# Patient Record
Sex: Female | Born: 1990 | Race: White | Hispanic: No | Marital: Single | Smoking: Never smoker
Health system: Southern US, Community
[De-identification: ages and names within clinical notes are randomized; demographics above are authoritative.]

## PROBLEM LIST (undated history)

## (undated) DIAGNOSIS — Z789 Other specified health status: Secondary | ICD-10-CM

## (undated) DIAGNOSIS — E282 Polycystic ovarian syndrome: Secondary | ICD-10-CM

---

## 2013-12-29 ENCOUNTER — Other Ambulatory Visit (HOSPITAL_COMMUNITY)
Admission: RE | Admit: 2013-12-29 | Discharge: 2013-12-29 | Disposition: A | Discharge status: Home / Self Care | Payer: Managed Care, Other (non HMO) | Source: Ambulatory Visit | Attending: Obstetrics and Gynecology | Admitting: Obstetrics and Gynecology

## 2013-12-29 DIAGNOSIS — Z01419 Encounter for gynecological examination (general) (routine) without abnormal findings: Secondary | ICD-10-CM | POA: Diagnosis not present

## 2013-12-29 DIAGNOSIS — Z113 Encounter for screening for infections with a predominantly sexual mode of transmission: Secondary | ICD-10-CM | POA: Insufficient documentation

## 2015-02-25 ENCOUNTER — Other Ambulatory Visit (HOSPITAL_COMMUNITY)
Admission: RE | Admit: 2015-02-25 | Discharge: 2015-02-25 | Disposition: A | Discharge status: Home / Self Care | Payer: Managed Care, Other (non HMO) | Source: Ambulatory Visit | Attending: Obstetrics and Gynecology | Admitting: Obstetrics and Gynecology

## 2015-02-25 ENCOUNTER — Other Ambulatory Visit: Payer: Self-pay | Admitting: Obstetrics and Gynecology

## 2015-02-25 DIAGNOSIS — Z113 Encounter for screening for infections with a predominantly sexual mode of transmission: Secondary | ICD-10-CM | POA: Insufficient documentation

## 2015-02-25 DIAGNOSIS — Z01411 Encounter for gynecological examination (general) (routine) with abnormal findings: Secondary | ICD-10-CM | POA: Insufficient documentation

## 2015-02-26 LAB — CYTOLOGY - PAP

## 2015-04-27 ENCOUNTER — Ambulatory Visit (INDEPENDENT_AMBULATORY_CARE_PROVIDER_SITE_OTHER): Payer: Managed Care, Other (non HMO) | Admitting: Physician Assistant

## 2015-04-27 VITALS — BP 136/70 | HR 75 | Temp 98.8°F | Resp 18 | Ht 63.0 in | Wt 147.0 lb

## 2015-04-27 DIAGNOSIS — N3090 Cystitis, unspecified without hematuria: Secondary | ICD-10-CM

## 2015-04-27 DIAGNOSIS — R300 Dysuria: Secondary | ICD-10-CM | POA: Diagnosis not present

## 2015-04-27 LAB — POCT UA - MICROSCOPIC ONLY
Casts, Ur, LPF, POC: NEGATIVE
Crystals, Ur, HPF, POC: NEGATIVE
MUCUS UA: POSITIVE
RBC, urine, microscopic: NEGATIVE
Yeast, UA: NEGATIVE

## 2015-04-27 LAB — POCT URINALYSIS DIPSTICK
BILIRUBIN UA: NEGATIVE
Glucose, UA: NEGATIVE
Ketones, UA: NEGATIVE
Nitrite, UA: NEGATIVE
PROTEIN UA: NEGATIVE
Spec Grav, UA: 1.02
Urobilinogen, UA: 0.2
pH, UA: 6.5

## 2015-04-27 LAB — POCT URINE PREGNANCY: Preg Test, Ur: NEGATIVE

## 2015-04-27 MED ORDER — NITROFURANTOIN MONOHYD MACRO 100 MG PO CAPS: 100.0000 mg | ORAL_CAPSULE | Freq: Two times a day (BID) | ORAL | Status: AC

## 2015-04-27 NOTE — Progress Notes (Signed)
[facility] [address], [place] [address] [phone]  Date:  04/27/2015   Name:  Natalie Zimmerman   DOB:  03/23/1991   MRN:  [id number]  PCP:  No primary care provider on file.    Chief Complaint: Dysuria and Urinary Frequency   History of Present Illness:  This is a 24 y.o. female who is presenting with dysuria and urinary frequency x 2 days.  Dysuria: yes Urinary Frequency: yes Vaginal discharge: no Hematuria: no Abdominal pain: no Fever/chills: no Nausea/vominting: no Back pain: no LMP: 03/26/15, periods are not regular, sometimes goes 2 months without having a period. Sexually active: yes, not concerned for STDs. Previous UTI: Last UTI 1 year ago. Aggravating/Alleviating factors: Drinking lots of water.  Review of Systems:  Review of Systems See HPI  There are no active problems to display for this patient.   Prior to Admission medications   Not on File    No Known Allergies  History reviewed. No pertinent past surgical history.  Social History  Substance Use Topics  . Smoking status: Never Smoker   . Smokeless tobacco: None  . Alcohol Use: No    History reviewed. No pertinent family history.  Medication list has been reviewed and updated.  Physical Examination:  Physical Exam  Constitutional: She is oriented to person, place, and time. She appears well-developed and well-nourished. No distress.  HENT:  Head: Normocephalic and atraumatic.  Right Ear: Hearing normal.  Left Ear: Hearing normal.  Nose: Nose normal.  Eyes: Conjunctivae and lids are normal. Right eye exhibits no discharge. Left eye exhibits no discharge. No scleral icterus.  Cardiovascular: Normal rate, regular rhythm, normal heart sounds and normal pulses.   No murmur heard. Pulmonary/Chest: Effort normal and breath sounds normal. No respiratory distress. She has no wheezes. She has no rhonchi. She has no rales.  Abdominal: Soft. Normal appearance. There is  no tenderness. There is no CVA tenderness.  No suprapubic tenderness  Musculoskeletal: Normal range of motion.  Neurological: She is alert and oriented to person, place, and time.  Skin: Skin is warm, dry and intact. No lesion and no rash noted.  Psychiatric: She has a normal mood and affect. Her speech is normal and behavior is normal. Thought content normal.    BP 136/70 mmHg  Pulse 75  Temp(Src) 98.8 F (37.1 C) (Oral)  Resp 18  Ht 5\' 3"  (1.6 m)  Wt 147 lb (66.679 kg)  BMI 26.05 kg/m2  SpO2 94%  LMP 03/26/2015  Results for orders placed or performed in visit on 04/27/15  POCT UA - Microscopic Only  Result Value Ref Range   WBC, Ur, HPF, POC 10-12    RBC, urine, microscopic neg    Bacteria, U Microscopic trace    Mucus, UA positive    Epithelial cells, urine per micros 0-3    Crystals, Ur, HPF, POC neg    Casts, Ur, LPF, POC neg    Yeast, UA neg   POCT urinalysis dipstick  Result Value Ref Range   Color, UA yellow    Clarity, UA clear    Glucose, UA neg    Bilirubin, UA neg    Ketones, UA neg    Spec Grav, UA 1.020    Blood, UA tr-lysed    pH, UA 6.5    Protein, UA neg    Urobilinogen, UA 0.2    Nitrite, UA neg    Leukocytes, UA large (3+) (A) Negative  POCT  urine pregnancy  Result Value Ref Range   Preg Test, Ur Negative Negative   Assessment and Plan:  1. Cystitis 2. Dysuria UA suggestive of UTI. Urine culture pending. Macrobid BID x 7 days. Counseled on hydration and UTI prevention. Return in 1 week if sx not improved or at any time if sx worsen. - nitrofurantoin, macrocrystal-monohydrate, (MACROBID) 100 MG capsule; Take 1 capsule (100 mg total) by mouth 2 (two) times daily.  Dispense: 14 capsule; Refill: 0 - Urine culture - POCT urine pregnancy - POCT UA - Microscopic Only - POCT urinalysis dipstick   [place] Miners. Dyke Brackett, MHS [facility]  04/27/2015

## 2015-04-27 NOTE — Patient Instructions (Signed)
Take antibiotic twice a day for 7 days. Drink plenty of water (at least 64 oz per day). Cranberry juice/pills can help. You can buy AZO over the counter and take as needed for pain. Follow directions on the packaging. I will call you with results from your urine culture. If symptoms are not improving in 1 week or if symptoms worsen at any time, return to clinic.

## 2015-04-29 LAB — URINE CULTURE: Colony Count: >=100000

## 2015-05-09 ENCOUNTER — Encounter: Payer: Self-pay | Admitting: Family Medicine

## 2015-05-31 ENCOUNTER — Emergency Department (HOSPITAL_COMMUNITY)
Admission: EM | Admit: 2015-05-31 | Discharge: 2015-05-31 | Disposition: A | Discharge status: Home / Self Care | Payer: Managed Care, Other (non HMO) | Source: Home / Self Care | Attending: Family Medicine | Admitting: Family Medicine

## 2015-05-31 ENCOUNTER — Encounter (HOSPITAL_COMMUNITY): Payer: Self-pay

## 2015-05-31 DIAGNOSIS — T17320A Food in larynx causing asphyxiation, initial encounter: Secondary | ICD-10-CM

## 2015-05-31 DIAGNOSIS — R072 Precordial pain: Secondary | ICD-10-CM

## 2015-05-31 LAB — POCT PREGNANCY, URINE: Preg Test, Ur: NEGATIVE

## 2015-05-31 MED ORDER — SUCRALFATE 1 G PO TABS: 1.0000 g | ORAL_TABLET | Freq: Three times a day (TID) | ORAL | Status: DC

## 2015-05-31 NOTE — ED Provider Notes (Signed)
CSN: [id number]     Arrival date & time 05/31/15  1305 History   First MD Initiated Contact with Patient 05/31/15 1327     Chief Complaint  Patient presents with  . Pleurisy   (Consider location/radiation/quality/duration/timing/severity/associated sxs/prior Treatment) HPI Comments: 24 year old female was eating macaroni and cheese 4 days ago and began to choke. At just that she was starting to turn blue one of her great toe friends grabbed her around the waist and performed the abdominal thrust maneuver. This relieved her obstruction and she was able to breathe well. Since that time she has been complaining of retrosternal pain. This is affected by taking a deep breath. She denies abdominal pain. She denies problems with swallowing. She has been eating for the last 4 days without difficulty, pain, vomiting or nausea. Her primary concern is the persistence of the retrosternal discomfort.   History reviewed. No pertinent past medical history. History reviewed. No pertinent past surgical history. History reviewed. No pertinent family history. Social History  Substance Use Topics  . Smoking status: Never Smoker   . Smokeless tobacco: None  . Alcohol Use: No   OB History    No data available     Review of Systems  Constitutional: Negative for fever, activity change and fatigue.  Respiratory: Negative.  Negative for cough, shortness of breath, wheezing and stridor.        No choking since the initial event.  Cardiovascular: Positive for chest pain. Negative for leg swelling.  Gastrointestinal: Negative.   Genitourinary: Negative.   Skin: Negative.     Allergies  Review of patient's allergies indicates no known allergies.  Home Medications   Prior to Admission medications   Not on File   Meds Ordered and Administered this Visit  Medications - No data to display  BP 155/92 mmHg  Pulse 78  Temp(Src) 97.9 F (36.6 C) (Oral)  SpO2 99%  LMP  No data found.   Physical Exam   Constitutional: She is oriented to person, place, and time. She appears well-developed and well-nourished. No distress.  HENT:  Head: Normocephalic and atraumatic.  Mouth/Throat: Oropharynx is clear and moist. No oropharyngeal exudate.  Oropharynx normal. No erythema. No swelling no signs of injury. The epiglottis is easily visualized without erythema or injury. No sites of bleeding. No signs of infection.  Eyes: Conjunctivae and EOM are normal.  Neck: Normal range of motion. Neck supple. No tracheal deviation present. No thyromegaly present.  Cardiovascular: Normal rate, regular rhythm, normal heart sounds and intact distal pulses.   Pulmonary/Chest: Effort normal and breath sounds normal. No respiratory distress. She has no wheezes. She has no rales. She exhibits no tenderness.  Abdominal: Soft. There is no tenderness.  Musculoskeletal: Normal range of motion. She exhibits no edema.  Lymphadenopathy:    She has no cervical adenopathy.  Neurological: She is alert and oriented to person, place, and time.  Skin: Skin is warm and dry.  Psychiatric: She has a normal mood and affect.  Nursing note and vitals reviewed.   ED Course  Procedures (including critical care time)  Labs Review Labs Reviewed  POCT PREGNANCY, URINE    Imaging Review No results found.   Visual Acuity Review  Right Eye Distance:   Left Eye Distance:   Bilateral Distance:    Right Eye Near:   Left Eye Near:    Bilateral Near:         MDM   1. Retrosternal pain   2. Choking due to  food (regurgitated), initial encounter    suspect patient had irritation of the soft as after the abdominal thrust was performed. In the absence of other symptoms she is stable at this time and is recommended she follow-up with the gastroenterologist on page one. CHew food well. Take small bites at a time. For next few days no roughage type foods, salads. No steak or foods that may scratch the esophagus. Soft foods and lots  of liquids. Carafate as directed to coat esophagus and stomach for a few days. For worsening , new symptoms, inability to swallow, increase pain, vomiting go to the ED.     Hayden Rasmussen, NP 05/31/15 [phone]

## 2015-05-31 NOTE — ED Notes (Signed)
C/o trouble breathing, chest discomfort since choked on food Thursday, and someone did Heimlich on her abdominal area.Irregular menses

## 2015-05-31 NOTE — Discharge Instructions (Signed)
CHew food well. Take small bites at a time. For next few days no roughage type foods, salads. No steak or foods that may scratch the esophagus. Soft foods and lots of liquids. Carafate as directed to coat esophagus and stomach for a few days. For worsening , new symptoms, inability to swallow, increase pain, vomiting go to the ED.    Choking Choking occurs when a food or object gets stuck in the throat or trachea, blocking the airway. If the airway is partly blocked, coughing will usually cause the food or object to come out. If the airway is completely blocked, immediate action is needed to help it come out. A complete airway blockage is life threatening because it causes breathing to stop. Choking is a true medical emergency that requires fast, appropriate action by anyone available. SIGNS OF AIRWAY BLOCKAGE There is a partial airway blockage if you or the person who is choking is:   Able to breathe and speak.  Coughing loudly.  Making loud noises. There is a complete airway blockage if you or the person who is choking is:   Unable to breathe.  Making soft or high-pitched sounds while breathing.  Unable to cough or coughing weakly, ineffectively, or silently.  Unable to cry, speak, or make sounds.  Turning blue.  Holding the neck with both arms. This is the universal sign of choking. WHAT TO DO IF CHOKING OCCURS If there is a partial airway blockage, allow coughing to clear the airway. Do not try to drink until the food or object comes out. If someone else has a partial airway blockage, do not interfere. Stay with him or her and watch for signs of complete airway blockage until the food or object comes out.  If there is a complete airway blockage or if there is a partial airway blockage and the food or object does not come out, perform abdominal thrusts (also referred to as the Heimlich maneuver). Abdominal thrusts are used to create an artificial cough to try to clear the airway.  Performing abdominal thrusts is part of a series of steps that should be done to help someone who is choking. Abdominal thrusts are usually done by someone else, but if you are alone, you can perform abdominal thrusts on yourself. Follow the procedure below that best fits your situation.  IF SOMEONE ELSE IS CHOKING: For a conscious adult:  1. Ask the person whether he or she is choking. If the person nods, continue to step 2. 2. Stand or kneel behind the person and lean him or her forward slightly. 3. Make a fist with 1 hand, put your arms around the person, and grasp your fist with your other hand. Place the thumb side of your fist in the person's abdomen, just below the ribs. 4. Press inward and upward with both hands. 5. Repeat this maneuver until the object comes out and the person is able to breathe or until the person loses consciousness. For an unconscious adult: 1. Shout for help. If someone responds, have him or her call local emergency services (911 in [place] If no one responds, call local emergency services yourself if possible. 2. Begin CPR, starting with compressions. Every time you open the airway to give rescue breaths, open the person's mouth. If you can see the food or object and it can be easily pulled out, remove it with your fingers. 3. After 5 cycles or 2 minutes of CPR, call local emergency services (911 in [place] if you or someone  else did not already call. For a conscious adult who is obese or in the later stages of pregnancy: Abdominal thrusts may not be effective when helping people who are in the later stages of pregnancy or who are obese. In these instances, chest thrusts can be used.  1. Ask the person whether he or she is choking. If the person nods and has signs of complete airway blockage, continue to step 2. 2. Stand behind the person and wrap your arms around his or her chest (with your arms under the person's armpits). 3. Make a fist with 1 hand. Place the thumb side  of your fist on the middle of the person's breastbone. 4. Grab your fist with your other hand and thrust backward. Continue this until the object comes out or until the person becomes unconscious. For an unconscious adult who is obese or in the later stages of pregnancy:  1. Shout for help. If someone responds, have him or her call local emergency services (911 in [place] If no one responds, call local emergency services yourself if possible. 2. Begin CPR, starting with compressions. Every time you open the airway to give rescue breaths, open the person's mouth. If you can see the food or object and it can be easily pulled out, remove it with your fingers. 3. After 5 cycles or 2 minutes of CPR, call local emergency services (911 in [place] if you or someone else did not already call. Note that abdominal thrusts (below the rib cage) should be used for a pregnant woman if possible. This should be possible until the later stages of pregnancy when there is no longer enough room between the enlarging uterus and the rib cage to perform the maneuver. At that point, chest thrusts must be used as described. IF YOU ARE CHOKING: 1. Call local emergency services (911 in [place] if near a landline. Do not worry about communicating what is happening. Do not hang up the phone. Someone may be sent to help you anyway. 2. Make a fist with 1 hand. Put the thumb side of the fist against your stomach, just above the belly button and well below the breastbone. If you are pregnant or obese, put your fist on your chest instead, just below the breastbone and just above your lowest ribs. 3. Hold your fist with your other hand and bend over a hard surface, such as a table or chair. 4. Forcefully push your fist in and up. 5. Continue to do this until the food or object comes out. PREVENTION  To be prepared if choking occurs, learn how to correctly perform abdominal thrusts and give CPR by taking a certified first-aid training course.    SEEK IMMEDIATE MEDICAL CARE IF:  You have a fever after choking stops.  You have problems breathing after choking stops.  You received the Heimlich maneuver. MAKE SURE YOU:  Understand these instructions.  Watch your condition.  Get help right away if you are not doing well or get worse. Document Released: 10/05/2004 Document Revised: 01/12/2014 Document Reviewed: 04/09/2012 [facility] Patient Information 2015 [place], [place]. This information is not intended to replace advice given to you by your health care provider. Make sure you discuss any questions you have with your health care provider.

## 2016-02-29 ENCOUNTER — Other Ambulatory Visit: Payer: Self-pay | Admitting: Obstetrics and Gynecology

## 2016-02-29 ENCOUNTER — Other Ambulatory Visit (HOSPITAL_COMMUNITY)
Admission: RE | Admit: 2016-02-29 | Discharge: 2016-02-29 | Disposition: A | Discharge status: Home / Self Care | Payer: Managed Care, Other (non HMO) | Source: Ambulatory Visit | Attending: Obstetrics and Gynecology | Admitting: Obstetrics and Gynecology

## 2016-02-29 DIAGNOSIS — Z01411 Encounter for gynecological examination (general) (routine) with abnormal findings: Secondary | ICD-10-CM | POA: Diagnosis present

## 2016-02-29 DIAGNOSIS — Z113 Encounter for screening for infections with a predominantly sexual mode of transmission: Secondary | ICD-10-CM | POA: Insufficient documentation

## 2016-02-29 DIAGNOSIS — Z1151 Encounter for screening for human papillomavirus (HPV): Secondary | ICD-10-CM | POA: Diagnosis present

## 2016-03-01 LAB — CYTOLOGY - PAP

## 2016-04-04 ENCOUNTER — Other Ambulatory Visit: Payer: Self-pay | Admitting: Obstetrics and Gynecology

## 2016-10-10 ENCOUNTER — Other Ambulatory Visit (HOSPITAL_COMMUNITY)
Admission: RE | Admit: 2016-10-10 | Discharge: 2016-10-10 | Disposition: A | Discharge status: Home / Self Care | Payer: Managed Care, Other (non HMO) | Source: Ambulatory Visit | Attending: Obstetrics and Gynecology | Admitting: Obstetrics and Gynecology

## 2016-10-10 ENCOUNTER — Other Ambulatory Visit: Payer: Self-pay | Admitting: Obstetrics and Gynecology

## 2016-10-10 DIAGNOSIS — Z01411 Encounter for gynecological examination (general) (routine) with abnormal findings: Secondary | ICD-10-CM | POA: Insufficient documentation

## 2016-10-13 LAB — CYTOLOGY - PAP: Diagnosis: NEGATIVE

## 2017-09-11 DIAGNOSIS — F53 Postpartum depression: Secondary | ICD-10-CM

## 2017-09-11 HISTORY — DX: Postpartum depression: F53.0

## 2018-06-21 DIAGNOSIS — Z3490 Encounter for supervision of normal pregnancy, unspecified, unspecified trimester: Secondary | ICD-10-CM | POA: Diagnosis not present

## 2018-06-21 DIAGNOSIS — Z3201 Encounter for pregnancy test, result positive: Secondary | ICD-10-CM | POA: Diagnosis not present

## 2018-06-21 DIAGNOSIS — Z3401 Encounter for supervision of normal first pregnancy, first trimester: Secondary | ICD-10-CM | POA: Diagnosis not present

## 2018-06-21 DIAGNOSIS — Z3400 Encounter for supervision of normal first pregnancy, unspecified trimester: Secondary | ICD-10-CM | POA: Diagnosis not present

## 2018-06-24 DIAGNOSIS — Z3687 Encounter for antenatal screening for uncertain dates: Secondary | ICD-10-CM | POA: Diagnosis not present

## 2018-07-19 ENCOUNTER — Other Ambulatory Visit: Payer: Self-pay | Admitting: Obstetrics and Gynecology

## 2018-07-19 ENCOUNTER — Other Ambulatory Visit (HOSPITAL_COMMUNITY)
Admission: RE | Admit: 2018-07-19 | Discharge: 2018-07-19 | Disposition: A | Discharge status: Home / Self Care | Payer: BLUE CROSS/BLUE SHIELD | Source: Ambulatory Visit | Attending: Obstetrics and Gynecology | Admitting: Obstetrics and Gynecology

## 2018-07-19 DIAGNOSIS — Z3401 Encounter for supervision of normal first pregnancy, first trimester: Secondary | ICD-10-CM | POA: Diagnosis not present

## 2018-07-19 DIAGNOSIS — Z3402 Encounter for supervision of normal first pregnancy, second trimester: Secondary | ICD-10-CM | POA: Diagnosis not present

## 2018-07-19 DIAGNOSIS — N898 Other specified noninflammatory disorders of vagina: Secondary | ICD-10-CM | POA: Diagnosis not present

## 2018-07-23 LAB — CYTOLOGY - PAP
Chlamydia: NEGATIVE
Diagnosis: NEGATIVE
Neisseria Gonorrhea: NEGATIVE

## 2018-08-20 DIAGNOSIS — Z36 Encounter for antenatal screening for chromosomal anomalies: Secondary | ICD-10-CM | POA: Diagnosis not present

## 2018-08-20 DIAGNOSIS — Z3402 Encounter for supervision of normal first pregnancy, second trimester: Secondary | ICD-10-CM | POA: Diagnosis not present

## 2018-10-15 DIAGNOSIS — Z3490 Encounter for supervision of normal pregnancy, unspecified, unspecified trimester: Secondary | ICD-10-CM | POA: Diagnosis not present

## 2018-10-15 DIAGNOSIS — Z3403 Encounter for supervision of normal first pregnancy, third trimester: Secondary | ICD-10-CM | POA: Diagnosis not present

## 2018-11-05 DIAGNOSIS — Z23 Encounter for immunization: Secondary | ICD-10-CM | POA: Diagnosis not present

## 2018-11-27 ENCOUNTER — Other Ambulatory Visit: Payer: Self-pay

## 2018-11-27 ENCOUNTER — Encounter (HOSPITAL_COMMUNITY)
Admission: AD | Disposition: A | Discharge status: Home / Self Care | Payer: Self-pay | Source: Home / Self Care | Attending: Obstetrics & Gynecology

## 2018-11-27 ENCOUNTER — Inpatient Hospital Stay (HOSPITAL_COMMUNITY)
Admission: AD | Admit: 2018-11-27 | Discharge: 2018-11-30 | DRG: 788 | Disposition: A | Discharge status: Home / Self Care | Payer: BLUE CROSS/BLUE SHIELD | Attending: Obstetrics & Gynecology | Admitting: Obstetrics & Gynecology

## 2018-11-27 ENCOUNTER — Inpatient Hospital Stay (HOSPITAL_COMMUNITY): Payer: BLUE CROSS/BLUE SHIELD | Admitting: Anesthesiology

## 2018-11-27 ENCOUNTER — Encounter (HOSPITAL_COMMUNITY): Payer: Self-pay | Admitting: *Deleted

## 2018-11-27 DIAGNOSIS — D649 Anemia, unspecified: Secondary | ICD-10-CM | POA: Diagnosis present

## 2018-11-27 DIAGNOSIS — Z3A33 33 weeks gestation of pregnancy: Secondary | ICD-10-CM | POA: Diagnosis not present

## 2018-11-27 DIAGNOSIS — Z23 Encounter for immunization: Secondary | ICD-10-CM | POA: Diagnosis not present

## 2018-11-27 DIAGNOSIS — R011 Cardiac murmur, unspecified: Secondary | ICD-10-CM | POA: Diagnosis not present

## 2018-11-27 DIAGNOSIS — O9902 Anemia complicating childbirth: Secondary | ICD-10-CM | POA: Diagnosis not present

## 2018-11-27 DIAGNOSIS — Z3A00 Weeks of gestation of pregnancy not specified: Secondary | ICD-10-CM | POA: Diagnosis not present

## 2018-11-27 DIAGNOSIS — O328XX0 Maternal care for other malpresentation of fetus, not applicable or unspecified: Secondary | ICD-10-CM | POA: Diagnosis not present

## 2018-11-27 DIAGNOSIS — O321XX0 Maternal care for breech presentation, not applicable or unspecified: Secondary | ICD-10-CM | POA: Diagnosis not present

## 2018-11-27 DIAGNOSIS — Q211 Atrial septal defect: Secondary | ICD-10-CM | POA: Diagnosis not present

## 2018-11-27 LAB — CBC
HCT: 34.2 % — ABNORMAL LOW (ref 36.0–46.0)
Hemoglobin: 11.1 g/dL — ABNORMAL LOW (ref 12.0–15.0)
MCH: 29.2 pg (ref 26.0–34.0)
MCHC: 32.5 g/dL (ref 30.0–36.0)
MCV: 90 fL (ref 80.0–100.0)
NRBC: 0 % (ref 0.0–0.2)
PLATELETS: 309 10*3/uL (ref 150–400)
RBC: 3.8 MIL/uL — ABNORMAL LOW (ref 3.87–5.11)
RDW: 12.1 % (ref 11.5–15.5)
WBC: 13.1 10*3/uL — ABNORMAL HIGH (ref 4.0–10.5)

## 2018-11-27 LAB — COMPREHENSIVE METABOLIC PANEL
ALK PHOS: 96 U/L (ref 38–126)
ALT: 16 U/L (ref 0–44)
ANION GAP: 11 (ref 5–15)
AST: 20 U/L (ref 15–41)
Albumin: 3 g/dL — ABNORMAL LOW (ref 3.5–5.0)
BILIRUBIN TOTAL: 0.4 mg/dL (ref 0.3–1.2)
BUN: 6 mg/dL (ref 6–20)
CO2: 19 mmol/L — AB (ref 22–32)
Calcium: 9.1 mg/dL (ref 8.9–10.3)
Chloride: 106 mmol/L (ref 98–111)
Creatinine, Ser: 0.57 mg/dL (ref 0.44–1.00)
GFR calc Af Amer: >60 mL/min (ref >60)
GFR calc non Af Amer: >60 mL/min (ref >60)
GLUCOSE: 95 mg/dL (ref 70–99)
POTASSIUM: 3.9 mmol/L (ref 3.5–5.1)
Sodium: 136 mmol/L (ref 135–145)
Total Protein: 6.4 g/dL — ABNORMAL LOW (ref 6.5–8.1)

## 2018-11-27 LAB — TYPE AND SCREEN
ABO/RH(D): O POS
Antibody Screen: NEGATIVE

## 2018-11-27 LAB — PROTEIN / CREATININE RATIO, URINE
Creatinine, Urine: 68.8 mg/dL
PROTEIN CREATININE RATIO: 0.13 mg/mg{creat} (ref 0.00–0.15)
Total Protein, Urine: 9 mg/dL

## 2018-11-27 SURGERY — Surgical Case
Anesthesia: Spinal

## 2018-11-27 MED ORDER — LACTATED RINGERS IV SOLN
INTRAVENOUS | Status: DC | PRN
  Administered 2018-11-27 (×3): via INTRAVENOUS

## 2018-11-27 MED ORDER — NALBUPHINE HCL 10 MG/ML IJ SOLN: 5.0000 mg | INTRAMUSCULAR | Status: DC | PRN

## 2018-11-27 MED ORDER — KETOROLAC TROMETHAMINE 30 MG/ML IJ SOLN: 30.0000 mg | Freq: Four times a day (QID) | INTRAMUSCULAR | Status: AC | PRN

## 2018-11-27 MED ORDER — DIPHENHYDRAMINE HCL 25 MG PO CAPS: 25.0000 mg | ORAL_CAPSULE | ORAL | Status: DC | PRN

## 2018-11-27 MED ORDER — OXYTOCIN 10 UNIT/ML IJ SOLN
INTRAVENOUS | Status: DC | PRN
  Administered 2018-11-27: 40 [IU] via INTRAVENOUS

## 2018-11-27 MED ORDER — SCOPOLAMINE 1 MG/3DAYS TD PT72: 1.0000 | MEDICATED_PATCH | Freq: Once | TRANSDERMAL | Status: DC

## 2018-11-27 MED ORDER — MEPERIDINE HCL 25 MG/ML IJ SOLN: 6.2500 mg | INTRAMUSCULAR | Status: DC | PRN

## 2018-11-27 MED ORDER — DIPHENHYDRAMINE HCL 50 MG/ML IJ SOLN: 12.5000 mg | INTRAMUSCULAR | Status: DC | PRN

## 2018-11-27 MED ORDER — CEFAZOLIN SODIUM-DEXTROSE 2-4 GM/100ML-% IV SOLN
INTRAVENOUS | Status: AC
  Filled 2018-11-27: qty 100

## 2018-11-27 MED ORDER — OXYTOCIN 40 UNITS IN NORMAL SALINE INFUSION - SIMPLE MED
INTRAVENOUS | Status: AC
  Filled 2018-11-27: qty 1000

## 2018-11-27 MED ORDER — SOD CITRATE-CITRIC ACID 500-334 MG/5ML PO SOLN
30.0000 mL | Freq: Once | ORAL | Status: AC
  Administered 2018-11-27: 30 mL via ORAL

## 2018-11-27 MED ORDER — FENTANYL CITRATE (PF) 100 MCG/2ML IJ SOLN
INTRAMUSCULAR | Status: DC | PRN
  Administered 2018-11-27: 15 ug via INTRATHECAL

## 2018-11-27 MED ORDER — MORPHINE SULFATE (PF) 0.5 MG/ML IJ SOLN
INTRAMUSCULAR | Status: AC
  Filled 2018-11-27: qty 10

## 2018-11-27 MED ORDER — DEXAMETHASONE SODIUM PHOSPHATE 10 MG/ML IJ SOLN
INTRAMUSCULAR | Status: DC | PRN
  Administered 2018-11-27: 10 mg via INTRAVENOUS

## 2018-11-27 MED ORDER — PHENYLEPHRINE HCL-NACL 20-0.9 MG/250ML-% IV SOLN
INTRAVENOUS | Status: AC
  Filled 2018-11-27: qty 250

## 2018-11-27 MED ORDER — SODIUM BICARBONATE 8.4 % IV SOLN
INTRAVENOUS | Status: DC | PRN
  Administered 2018-11-27: 1.6 mL via EPIDURAL

## 2018-11-27 MED ORDER — NALOXONE HCL 4 MG/10ML IJ SOLN
1.0000 ug/kg/h | INTRAVENOUS | Status: DC | PRN
  Filled 2018-11-27: qty 5

## 2018-11-27 MED ORDER — FENTANYL CITRATE (PF) 100 MCG/2ML IJ SOLN: 25.0000 ug | INTRAMUSCULAR | Status: DC | PRN

## 2018-11-27 MED ORDER — BETAMETHASONE SOD PHOS & ACET 6 (3-3) MG/ML IJ SUSP
12.0000 mg | Freq: Once | INTRAMUSCULAR | Status: AC
  Administered 2018-11-27: 12 mg via INTRAMUSCULAR
  Filled 2018-11-27: qty 2

## 2018-11-27 MED ORDER — NALOXONE HCL 0.4 MG/ML IJ SOLN: 0.4000 mg | INTRAMUSCULAR | Status: DC | PRN

## 2018-11-27 MED ORDER — LACTATED RINGERS IV BOLUS
1000.0000 mL | Freq: Once | INTRAVENOUS | Status: AC
  Administered 2018-11-27: 1000 mL via INTRAVENOUS

## 2018-11-27 MED ORDER — MORPHINE SULFATE (PF) 0.5 MG/ML IJ SOLN
INTRAMUSCULAR | Status: DC | PRN
  Administered 2018-11-27: .15 mg via INTRATHECAL

## 2018-11-27 MED ORDER — SOD CITRATE-CITRIC ACID 500-334 MG/5ML PO SOLN
ORAL | Status: AC
  Filled 2018-11-27: qty 15

## 2018-11-27 MED ORDER — CEFAZOLIN SODIUM-DEXTROSE 2-4 GM/100ML-% IV SOLN
2.0000 g | INTRAVENOUS | Status: AC
  Administered 2018-11-27: 2 g via INTRAVENOUS

## 2018-11-27 MED ORDER — PHENYLEPHRINE HCL-NACL 20-0.9 MG/250ML-% IV SOLN
INTRAVENOUS | Status: DC | PRN
  Administered 2018-11-27: 60 ug/min via INTRAVENOUS

## 2018-11-27 MED ORDER — NALBUPHINE HCL 10 MG/ML IJ SOLN: 5.0000 mg | Freq: Once | INTRAMUSCULAR | Status: DC | PRN

## 2018-11-27 MED ORDER — SODIUM CHLORIDE 0.9% FLUSH
3.0000 mL | INTRAVENOUS | Status: DC | PRN
  Administered 2018-11-29: 3 mL via INTRAVENOUS
  Filled 2018-11-27: qty 3

## 2018-11-27 MED ORDER — ONDANSETRON HCL 4 MG/2ML IJ SOLN: 4.0000 mg | Freq: Three times a day (TID) | INTRAMUSCULAR | Status: DC | PRN

## 2018-11-27 MED ORDER — FENTANYL CITRATE (PF) 100 MCG/2ML IJ SOLN
INTRAMUSCULAR | Status: AC
  Filled 2018-11-27: qty 2

## 2018-11-27 MED ORDER — KETOROLAC TROMETHAMINE 30 MG/ML IJ SOLN
30.0000 mg | Freq: Four times a day (QID) | INTRAMUSCULAR | Status: AC | PRN
  Administered 2018-11-27: 30 mg via INTRAMUSCULAR

## 2018-11-27 MED ORDER — ONDANSETRON HCL 4 MG/2ML IJ SOLN
INTRAMUSCULAR | Status: AC
  Filled 2018-11-27: qty 2

## 2018-11-27 MED ORDER — KETOROLAC TROMETHAMINE 30 MG/ML IJ SOLN
INTRAMUSCULAR | Status: AC
  Filled 2018-11-27: qty 1

## 2018-11-27 MED ORDER — TERBUTALINE SULFATE 1 MG/ML IJ SOLN
0.2500 mg | Freq: Once | INTRAMUSCULAR | Status: AC
  Administered 2018-11-27: 0.25 mg via SUBCUTANEOUS
  Filled 2018-11-27: qty 1

## 2018-11-27 MED ORDER — ONDANSETRON HCL 4 MG/2ML IJ SOLN
INTRAMUSCULAR | Status: DC | PRN
  Administered 2018-11-27: 4 mg via INTRAVENOUS

## 2018-11-27 MED ORDER — ACETAMINOPHEN 500 MG PO TABS
1000.0000 mg | ORAL_TABLET | Freq: Four times a day (QID) | ORAL | Status: AC
  Administered 2018-11-28 (×4): 1000 mg via ORAL
  Filled 2018-11-27 (×4): qty 2

## 2018-11-27 MED ORDER — DEXAMETHASONE SODIUM PHOSPHATE 10 MG/ML IJ SOLN
INTRAMUSCULAR | Status: AC
  Filled 2018-11-27: qty 1

## 2018-11-27 SURGICAL SUPPLY — 40 items
BARRIER ADHS 3X4 INTERCEED (GAUZE/BANDAGES/DRESSINGS) ×3 IMPLANT
BENZOIN TINCTURE PRP APPL 2/3 (GAUZE/BANDAGES/DRESSINGS) ×3 IMPLANT
CHLORAPREP W/TINT 26ML (MISCELLANEOUS) ×3 IMPLANT
CLAMP CORD UMBIL (MISCELLANEOUS) IMPLANT
CLOSURE WOUND 1/2 X4 (GAUZE/BANDAGES/DRESSINGS) ×1
CLOTH BEACON ORANGE TIMEOUT ST (SAFETY) ×3 IMPLANT
DERMABOND ADVANCED (GAUZE/BANDAGES/DRESSINGS)
DERMABOND ADVANCED .7 DNX12 (GAUZE/BANDAGES/DRESSINGS) IMPLANT
DRSG OPSITE POSTOP 4X10 (GAUZE/BANDAGES/DRESSINGS) ×3 IMPLANT
ELECT REM PT RETURN 9FT ADLT (ELECTROSURGICAL) ×3
ELECTRODE REM PT RTRN 9FT ADLT (ELECTROSURGICAL) ×1 IMPLANT
EXTRACTOR VACUUM KIWI (MISCELLANEOUS) IMPLANT
GLOVE BIOGEL PI IND STRL 7.0 (GLOVE) ×3 IMPLANT
GLOVE BIOGEL PI INDICATOR 7.0 (GLOVE) ×6
GLOVE ECLIPSE 6.5 STRL STRAW (GLOVE) ×3 IMPLANT
GOWN STRL REUS W/TWL LRG LVL3 (GOWN DISPOSABLE) ×9 IMPLANT
HEMOSTAT ARISTA ABSORB 3G PWDR (HEMOSTASIS) ×3 IMPLANT
KIT ABG SYR 3ML LUER SLIP (SYRINGE) IMPLANT
NEEDLE HYPO 25X5/8 SAFETYGLIDE (NEEDLE) IMPLANT
NS IRRIG 1000ML POUR BTL (IV SOLUTION) ×3 IMPLANT
PACK C SECTION WH (CUSTOM PROCEDURE TRAY) ×3 IMPLANT
PAD ABD 7.5X8 STRL (GAUZE/BANDAGES/DRESSINGS) ×3 IMPLANT
PAD OB MATERNITY 4.3X12.25 (PERSONAL CARE ITEMS) ×3 IMPLANT
PENCIL SMOKE EVAC W/HOLSTER (ELECTROSURGICAL) ×3 IMPLANT
RTRCTR C-SECT PINK 25CM LRG (MISCELLANEOUS) ×3 IMPLANT
STRIP CLOSURE SKIN 1/2X4 (GAUZE/BANDAGES/DRESSINGS) ×2 IMPLANT
SUT PLAIN 0 NONE (SUTURE) IMPLANT
SUT PLAIN 2 0 (SUTURE) ×2
SUT PLAIN 2 0 XLH (SUTURE) IMPLANT
SUT PLAIN ABS 2-0 CT1 27XMFL (SUTURE) ×1 IMPLANT
SUT VIC AB 0 CT1 27 (SUTURE) ×4
SUT VIC AB 0 CT1 27XBRD ANBCTR (SUTURE) ×2 IMPLANT
SUT VIC AB 0 CTX 36 (SUTURE) ×6
SUT VIC AB 0 CTX36XBRD ANBCTRL (SUTURE) ×3 IMPLANT
SUT VIC AB 2-0 CT1 27 (SUTURE) ×4
SUT VIC AB 2-0 CT1 TAPERPNT 27 (SUTURE) ×2 IMPLANT
SUT VIC AB 4-0 KS 27 (SUTURE) ×3 IMPLANT
TOWEL OR 17X24 6PK STRL BLUE (TOWEL DISPOSABLE) ×3 IMPLANT
TRAY FOLEY W/BAG SLVR 14FR LF (SET/KITS/TRAYS/PACK) IMPLANT
WATER STERILE IRR 1000ML POUR (IV SOLUTION) ×6 IMPLANT

## 2018-11-27 NOTE — Transfer of Care (Signed)
Immediate Anesthesia Transfer of Care Note  Patient: Natalie Zimmerman  Procedure(s) Performed: CESAREAN SECTION (N/A )  Patient Location: PACU  Anesthesia Type:Spinal  Level of Consciousness: awake, alert  and oriented  Airway & Oxygen Therapy: Patient Spontanous Breathing  Post-op Assessment: Report given to RN and Post -op Vital signs reviewed and stable  Post vital signs: Reviewed and stable  Last Vitals:  Vitals Value Taken Time  BP 149/135 11/27/2018 10:16 PM  Temp    Pulse 95 11/27/2018 10:19 PM  Resp 29 11/27/2018 10:19 PM  SpO2 97 % 11/27/2018 10:19 PM  Vitals shown include unvalidated device data.  Last Pain:  Vitals:   11/27/18 1922  TempSrc:   PainSc: 6       Patients Stated Pain Goal: 0 (11/27/18 1922)  Complications: No apparent anesthesia complications

## 2018-11-27 NOTE — MAU Note (Addendum)
Called NICU charge nurse and let her know about the patient. She stated she will call the NEO and discuss the patient with him. Howell Rucks nurse stated @2046  that Neo is aware of pt.   Ozan in department at 2030 on phone with OR.  L&D charge nurse notified @ 2034.  [facility] charge nurse notified @ 2035.  Anesthesia Casilda Carls notified 2038. At bedside @2045 .

## 2018-11-27 NOTE — Op Note (Signed)
PreOp Diagnosis:  -Intrauterine pregnancy @ [redacted]w[redacted]d -Preterm labor -Breech presentation PostOp Diagnosis: same Procedure: Primary C-section Surgeon: Dr. Myna Hidalgo Anesthesia: spinal Complications: none EBL: 300cc UOP: 150cc Fluids: 2300cc   Findings: Female infant from complete breech presentation, tight nuchal cord x 1, normal uterus tubes and ovaries bilaterally  PROCEDURE:  Informed consent was obtained from the patient with risks, benefits, complications, treatment options, and expected outcomes discussed with the patient.  The patient concurred with the proposed plan, giving informed consent with form signed.   The patient was taken to Operating Room, and identified with the procedure verified as C-Section Delivery with Time Out. With induction of anesthesia, the patient was prepped and draped in the usual sterile fashion. A Pfannenstiel incision was made and carried down through the subcutaneous tissue to the fascia.  Rectus muscle noted to have active bleeding- figure of eight placed- hemostasis noted.  The fascia was incised in the midline and extended transversely. The superior aspect of the fascial incision was grasped with Kochers elevated and the underlying muscle dissected off. The inferior aspect of the facial incision was in similar fashion, grasped elevated and rectus muscles dissected off. The peritoneum was identified and entered. Peritoneal incision was extended longitudinally. The utero-vesical peritoneal reflection was identified and incised transversely with the [facility] scissors, the incision extended laterally, the bladder flap created digitally. A low transverse uterine incision was made- sacrum present at delivery.  Right leg was flexed and delivered, infant was rotated and delivery of the 2nd leg occurred.  The baby was then rotated to allow for delivery of each arm.  M[place]-Smellie-Veit was used to flex the head and delivered.  After the umbilical cord was clamped and  cut cord blood was obtained for evaluation.   The placenta was removed intact and appeared normal. The uterine outline, tubes and ovaries appeared normal. The uterine incision was closed with running locked sutures of 0 Vicryl and a second layer of the same stitch was used in an imbricating fashion.  Excellent hemostasis was obtained.  The pericolic gutters were then cleared of all clots and debris.  Interceed was placed over the hysterotomy. The peritoneum was closed in a running fashion.  Arista was placed on the rectus muscle- where prior bleeding was noted.  Excellent hemostasis was noted. The fascia was reapproximated with running sutures of 0 Vicryl. The subcutaneous tissue was reapproximated with 2-0 plain gut suture.  The skin was closed with 4-0 vicryl in a subcuticular fashion.  Instrument, sponge, and needle counts were correct prior the abdominal closure and at the conclusion of the case. The patient was taken to recovery in stable condition.  Myna Hidalgo, DO [phone] (cell) [phone] (office)

## 2018-11-27 NOTE — MAU Note (Signed)
Pt reports to MAU c/o vaginal bleeding that started around 1900 and pain that is every 3-5 min that started around 1515. Pt reports she passed clots and came straight here. Pt reports no complications in the pregnancy thus far. Pt denies LOF. +FM.

## 2018-11-27 NOTE — Anesthesia Preprocedure Evaluation (Signed)
Anesthesia Evaluation  Patient identified by MRN, date of birth, ID band Patient awake    Reviewed: Allergy & Precautions, NPO status , Patient's Chart, lab work & pertinent test results  Airway Mallampati: II  TM Distance: >3 FB     Dental   Pulmonary neg pulmonary ROS,    breath sounds clear to auscultation       Cardiovascular negative cardio ROS   Rhythm:Regular Rate:Normal     Neuro/Psych negative neurological ROS     GI/Hepatic negative GI ROS, Neg liver ROS,   Endo/Other  negative endocrine ROS  Renal/GU negative Renal ROS     Musculoskeletal   Abdominal   Peds  Hematology  (+) anemia ,   Anesthesia Other Findings   Reproductive/Obstetrics (+) Pregnancy (G1 c/s for breech)                             Lab Results  Component Value Date   WBC 13.1 (H) 11/27/2018   HGB 11.1 (L) 11/27/2018   HCT 34.2 (L) 11/27/2018   MCV 90.0 11/27/2018   PLT 309 11/27/2018    Anesthesia Physical Anesthesia Plan  ASA: II  Anesthesia Plan: Spinal   Post-op Pain Management:    Induction:   PONV Risk Score and Plan: Treatment may vary due to age or medical condition, Dexamethasone and Ondansetron  Airway Management Planned: Natural Airway  Additional Equipment:   Intra-op Plan:   Post-operative Plan:   Informed Consent: I have reviewed the patients History and Physical, chart, labs and discussed the procedure including the risks, benefits and alternatives for the proposed anesthesia with the patient or authorized representative who has indicated his/her understanding and acceptance.       Plan Discussed with: CRNA  Anesthesia Plan Comments:         Anesthesia Quick Evaluation

## 2018-11-27 NOTE — H&P (Signed)
HPI: 28 y/o G1P0 @ [redacted]w[redacted]d estimated gestational age (as dated by LMP c/w 20 week ultrasound) presents complaining of painful contractions that started around 4pm and continued to get progressively worse.  Additionally around 7pm.   no Leaking of Fluid,   + Vaginal Bleeding,   + Uterine Contractions,  + Fetal Movement.  Prenatal care has been provided by Dr. Richardson Dopp  ROS: no HA, no epigastric pain, no visual changes.    Pregnancy uncomplicated   Prenatal Transfer Tool  Maternal Diabetes: No Genetic Screening: Normal Maternal Ultrasounds/Referrals: Normal Fetal Ultrasounds or other Referrals:  None Maternal Substance Abuse:  No Significant Maternal Medications:  None Significant Maternal Lab Results: None  GBS unknown   PNL:  GBS unknown, Rub Immune, Hep B neg, RPR NR, HIV neg, GC/C neg, glucola:normal O positive, antibody neg   OBHx: primip PMHx:  none Meds:  PNV Allergy:  No Known Allergies SurgHx: none SocHx:   no Tobacco, no  EtOH, no Illicit Drugs  O: BP 128/75   Pulse 89   Temp 98.4 F (36.9 C) (Oral)   Resp 18   Ht 5\' 2"  (1.575 m)   Wt 74.2 kg   SpO2 100%   BMI 29.90 kg/m  Gen. AAOx3, NAD CV.  RRR  Resp. Normal respiratory rate and effort Abd. Gravid,  no tenderness,  no rigidity,  no guarding Extr.  no edema B/L , no calf tenderness, neg Homan's B/L FHT: 140 baseline, moderate variability, + accels,  no decels Toco: q 3 min SVE: deferred- per MAU staff 7cm with bulging bag- footling presentation  Labs:  Results for orders placed or performed during the hospital encounter of 11/27/18 (from the past 24 hour(s))  CBC     Status: Abnormal   Collection Time: 11/27/18  7:57 PM  Result Value Ref Range   WBC 13.1 (H) 4.0 - 10.5 K/uL   RBC 3.80 (L) 3.87 - 5.11 MIL/uL   Hemoglobin 11.1 (L) 12.0 - 15.0 g/dL   HCT 34.1 (L) 93.7 - 90.2 %   MCV 90.0 80.0 - 100.0 fL   MCH 29.2 26.0 - 34.0 pg   MCHC 32.5 30.0 - 36.0 g/dL   RDW 40.9 73.5 - 32.9 %   Platelets 309 150 -  400 K/uL   nRBC 0.0 0.0 - 0.2 %  Comprehensive metabolic panel     Status: Abnormal   Collection Time: 11/27/18  7:57 PM  Result Value Ref Range   Sodium 136 135 - 145 mmol/L   Potassium 3.9 3.5 - 5.1 mmol/L   Chloride 106 98 - 111 mmol/L   CO2 19 (L) 22 - 32 mmol/L   Glucose, Bld 95 70 - 99 mg/dL   BUN 6 6 - 20 mg/dL   Creatinine, Ser 9.24 0.44 - 1.00 mg/dL   Calcium 9.1 8.9 - 26.8 mg/dL   Total Protein 6.4 (L) 6.5 - 8.1 g/dL   Albumin 3.0 (L) 3.5 - 5.0 g/dL   AST 20 15 - 41 U/L   ALT 16 0 - 44 U/L   Alkaline Phosphatase 96 38 - 126 U/L   Total Bilirubin 0.4 0.3 - 1.2 mg/dL   GFR calc non Af Amer >60 >60 mL/min   GFR calc Af Amer >60 >60 mL/min   Anion gap 11 5 - 15  Type and screen     Status: None (Preliminary result)   Collection Time: 11/27/18  8:20 PM  Result Value Ref Range   ABO/RH(D) PENDING  Antibody Screen PENDING    Sample Expiration      11/30/2018 Performed at [facility], [address] [place], [place] [address]      A/P:  28 y.o. G1P0 @ [redacted]w[redacted]d EGA who presents in active preterm labor with Breech presentation -FWB:  NICHD Cat I FHTs -Preterm labor: Terbutaline given, Betamethasone ordered and IV fluids given -GBS: unknown -Due to breech presentation recommendation for primary C-section- Risk benefits and alternatives of cesarean section were discussed with the patient including but not limited to infection, bleeding, damage to bowel , bladder and baby with the need for further surgery. Pt voiced understanding and desires to proceed.   Myna Hidalgo, DO [phone] (cell) [phone] (office)

## 2018-11-27 NOTE — MAU Provider Note (Signed)
History     CSN: [id number]  Arrival date and time: 11/27/18 4709   First Provider Initiated Contact with Patient 11/27/18 1958      Chief Complaint  Patient presents with  . Vaginal Bleeding  . Abdominal Pain   Ms. Natalie Zimmerman is a 28 y.o. G1P0 at [redacted]w[redacted]d who presents to MAU for vaginal bleeding which began 59. Pt reports around 1515 today,"pains" started in her pelvis. Pt reports the pain is midline, intermittent, at first just felt like pressure, then turned to pain that she is unable to describe further. Pt rates pain as 5-6/10. Pt reports at this time she called her office and was told to drink water and lay on her left hand side. Pt reports she was instructed to report to MAU if the pains got worse. Pt started timing the pains a round 5pm and noticed they were coming every 3-58min. At 1900 she went to the bathroom and noticed clots of blood about the size of a quarter on her underwear and in the toilet. Pt also reports she went to the bathroom in MAU x1 and saw clots here as well. Pt reports pain has gotten worse since 1515 today, but is not coming any closer together.  Pt denies LOF, decreased FM.  Lightheaded/dizzy? no Smoking/drug use? no HTN? no Current pregnancy problems? no Hx of C/S or GYN surgery? no Blood Type? unknown Allergies? no Current medications? PNVs Current PNC & next appt? [facility], next appt 12/04/2018    OB History    Gravida  1   Para      Term      Preterm      AB      Living        SAB      TAB      Ectopic      Multiple      Live Births              History reviewed. No pertinent past medical history.  History reviewed. No pertinent surgical history.  Family History  Problem Relation Age of Onset  . Cancer Maternal Grandmother   . Heart disease Maternal Grandfather   . Cancer Paternal Grandfather   . Cancer Maternal Aunt   . Cancer Paternal Aunt     Social History   Tobacco Use  . Smoking status: Never Smoker   . Smokeless tobacco: Never Used  Substance Use Topics  . Alcohol use: No    Alcohol/week: 0.0 standard drinks  . Drug use: No    Allergies: No Known Allergies  Medications Prior to Admission  Medication Sig Dispense Refill Last Dose  . sucralfate (CARAFATE) 1 G tablet Take 1 tablet (1 g total) by mouth 4 (four) times daily -  with meals and at bedtime. 20 tablet 0     Review of Systems  Respiratory: Negative for shortness of breath.   Cardiovascular: Negative for chest pain.  Gastrointestinal: Negative for abdominal pain, constipation, diarrhea, nausea and vomiting.  Genitourinary: Positive for vaginal bleeding. Negative for vaginal discharge.  Neurological: Negative for dizziness, weakness, light-headedness and headaches.   Physical Exam   Blood pressure (!) 145/97, pulse 92, temperature 98.4 F (36.9 C), temperature source Oral, resp. rate 18, height 5\' 2"  (1.575 m), weight 74.2 kg, SpO2 99 %.  Physical Exam  Constitutional: She is oriented to person, place, and time. She appears well-developed and well-nourished. No distress.  HENT:  Head: Normocephalic.  Respiratory: Effort normal.  GI:  There is generalized abdominal tenderness.  Genitourinary: There is no rash, tenderness, lesion or injury on the right labia. There is no rash, tenderness, lesion or injury on the left labia.    Vaginal bleeding present.  There is bleeding in the vagina.    Genitourinary Comments: On speculum exam, bulging membranes visualized. On cervical exam, bulging membranes palpated along with footling breech (confirmed by [place]), 7-8cm dilated, 90% effaced, cord not palpated.   Neurological: She is alert and oriented to person, place, and time.  Skin: Skin is warm and dry. She is not diaphoretic.  Psychiatric: She has a normal mood and affect. Her behavior is normal.    MAU Course  Procedures  MDM -preeclampsia labs placed d/t BP on admission -after exam, Dr. Charlotta Newton and Dr. Despina Hidden notifed -type &  screen added for OR -IV fluids, bethamethsone, terbutaline given -EFM baseline 130, mod variability, pos accels, neg decels. TOCO ctx q1-1min. -Dr. Charlotta Newton arrived, pt to OR  Pt informed that the ultrasound is considered a limited OB ultrasound and is not intended to be a complete ultrasound exam.  Patient also informed that the ultrasound is not being completed with the intent of assessing for fetal or placental anomalies or any pelvic abnormalities.  Explained that the purpose of today's ultrasound is to assess for  presentation. Breech position noted on [place]. Patient acknowledges the purpose of the exam and the limitations of the study.    Assessment and Plan   1. Preterm labor in third trimester with preterm delivery, single or unspecified fetus   2. [redacted] weeks gestation of pregnancy    -to OR with Dr. Vira Blanco E [name] 11/27/2018, 7:59 PM

## 2018-11-27 NOTE — Anesthesia Procedure Notes (Signed)
Spinal  Patient location during procedure: OR Start time: 11/27/2018 9:00 PM End time: 11/27/2018 9:04 PM Staffing Anesthesiologist: Marcene Duos, MD Performed: anesthesiologist  Preanesthetic Checklist Completed: patient identified, site marked, surgical consent, pre-op evaluation, timeout performed, IV checked, risks and benefits discussed and monitors and equipment checked Spinal Block Patient position: sitting Prep: DuraPrep Patient monitoring: heart rate, cardiac monitor, continuous pulse ox and blood pressure Approach: midline Location: L3-4 Injection technique: single-shot Needle Needle type: Pencan  Needle gauge: 24 G Needle length: 9 cm Assessment Sensory level: T4

## 2018-11-28 ENCOUNTER — Encounter (HOSPITAL_COMMUNITY): Payer: Self-pay | Admitting: Obstetrics & Gynecology

## 2018-11-28 LAB — CBC
HCT: 30.3 % — ABNORMAL LOW (ref 36.0–46.0)
Hemoglobin: 10 g/dL — ABNORMAL LOW (ref 12.0–15.0)
MCH: 29.5 pg (ref 26.0–34.0)
MCHC: 33 g/dL (ref 30.0–36.0)
MCV: 89.4 fL (ref 80.0–100.0)
PLATELETS: 266 10*3/uL (ref 150–400)
RBC: 3.39 MIL/uL — ABNORMAL LOW (ref 3.87–5.11)
RDW: 12.2 % (ref 11.5–15.5)
WBC: 18 10*3/uL — ABNORMAL HIGH (ref 4.0–10.5)
nRBC: 0 % (ref 0.0–0.2)

## 2018-11-28 LAB — ABO/RH: ABO/RH(D): O POS

## 2018-11-28 MED ORDER — SIMETHICONE 80 MG PO CHEW
80.0000 mg | CHEWABLE_TABLET | Freq: Three times a day (TID) | ORAL | Status: DC
  Administered 2018-11-28 – 2018-11-29 (×4): 80 mg via ORAL
  Filled 2018-11-28 (×4): qty 1

## 2018-11-28 MED ORDER — IBUPROFEN 600 MG PO TABS
600.0000 mg | ORAL_TABLET | Freq: Four times a day (QID) | ORAL | Status: DC | PRN
  Administered 2018-11-29 – 2018-11-30 (×4): 600 mg via ORAL
  Filled 2018-11-28 (×4): qty 1

## 2018-11-28 MED ORDER — ACETAMINOPHEN 325 MG PO TABS: 650.0000 mg | ORAL_TABLET | ORAL | Status: DC | PRN

## 2018-11-28 MED ORDER — LACTATED RINGERS IV SOLN: INTRAVENOUS | Status: DC

## 2018-11-28 MED ORDER — ZOLPIDEM TARTRATE 5 MG PO TABS: 5.0000 mg | ORAL_TABLET | Freq: Every evening | ORAL | Status: DC | PRN

## 2018-11-28 MED ORDER — TETANUS-DIPHTH-ACELL PERTUSSIS 5-2.5-18.5 LF-MCG/0.5 IM SUSP: 0.5000 mL | Freq: Once | INTRAMUSCULAR | Status: DC

## 2018-11-28 MED ORDER — KETOROLAC TROMETHAMINE 30 MG/ML IJ SOLN
30.0000 mg | Freq: Four times a day (QID) | INTRAMUSCULAR | Status: AC
  Administered 2018-11-28 (×3): 30 mg via INTRAVENOUS
  Filled 2018-11-28 (×3): qty 1

## 2018-11-28 MED ORDER — PRENATAL MULTIVITAMIN CH
1.0000 | ORAL_TABLET | Freq: Every day | ORAL | Status: DC
  Administered 2018-11-28 – 2018-11-29 (×2): 1 via ORAL
  Filled 2018-11-28 (×2): qty 1

## 2018-11-28 MED ORDER — COCONUT OIL OIL: 1.0000 "application " | TOPICAL_OIL | Status: DC | PRN

## 2018-11-28 MED ORDER — OXYCODONE HCL 5 MG PO TABS: 5.0000 mg | ORAL_TABLET | Freq: Four times a day (QID) | ORAL | Status: DC | PRN

## 2018-11-28 MED ORDER — DIBUCAINE 1 % RE OINT: 1.0000 "application " | TOPICAL_OINTMENT | RECTAL | Status: DC | PRN

## 2018-11-28 MED ORDER — SENNOSIDES-DOCUSATE SODIUM 8.6-50 MG PO TABS
2.0000 | ORAL_TABLET | ORAL | Status: DC
  Administered 2018-11-28 – 2018-11-30 (×3): 2 via ORAL
  Filled 2018-11-28 (×3): qty 2

## 2018-11-28 MED ORDER — OXYTOCIN 40 UNITS IN NORMAL SALINE INFUSION - SIMPLE MED: 2.5000 [IU]/h | INTRAVENOUS | Status: AC

## 2018-11-28 MED ORDER — MENTHOL 3 MG MT LOZG: 1.0000 | LOZENGE | OROMUCOSAL | Status: DC | PRN

## 2018-11-28 MED ORDER — WITCH HAZEL-GLYCERIN EX PADS: 1.0000 "application " | MEDICATED_PAD | CUTANEOUS | Status: DC | PRN

## 2018-11-28 MED ORDER — SIMETHICONE 80 MG PO CHEW: 80.0000 mg | CHEWABLE_TABLET | ORAL | Status: DC | PRN

## 2018-11-28 MED ORDER — SIMETHICONE 80 MG PO CHEW
80.0000 mg | CHEWABLE_TABLET | ORAL | Status: DC
  Administered 2018-11-28 – 2018-11-30 (×3): 80 mg via ORAL
  Filled 2018-11-28 (×3): qty 1

## 2018-11-28 MED ORDER — DIPHENHYDRAMINE HCL 25 MG PO CAPS: 25.0000 mg | ORAL_CAPSULE | Freq: Four times a day (QID) | ORAL | Status: DC | PRN

## 2018-11-28 NOTE — Plan of Care (Signed)
  Problem: Activity: Goal: Ability to tolerate increased activity will improve Outcome: Progressing   Problem: Coping: Goal: Ability to identify and utilize available resources and services will improve Outcome: Progressing   Problem: Life Cycle: Goal: Chance of risk for complications during the postpartum period will decrease Outcome: Progressing

## 2018-11-28 NOTE — Progress Notes (Signed)
Subjective: Postpartum Day 1: Cesarean Delivery Patient reports tolerating PO.   Pain is well controlled. Newborn is doing well in NICU  Objective: Vital signs in last 24 hours: Temp:  [97.6 F (36.4 C)-98.4 F (36.9 C)] 98 F (36.7 C) (03/19 1202) Pulse Rate:  [66-100] 66 (03/19 1202) Resp:  [17-27] 18 (03/19 1202) BP: (100-149)/(63-135) 101/68 (03/19 1202) SpO2:  [96 %-100 %] 99 % (03/19 1202) Weight:  [74.2 kg] 74.2 kg (03/18 1921)  Physical Exam:  General: alert, cooperative and no distress Lochia: appropriate Uterine Fundus: firm Incision: bandage intact .. small 2 cm area of serosanguinous stain on honeycomb  DVT Evaluation: No evidence of DVT seen on physical exam.  Recent Labs    11/27/18 1957 11/28/18 0430  HGB 11.1* 10.0*  HCT 34.2* 30.3*    Assessment/Plan: Status post Cesarean section. Doing well postoperatively.  Continue current care Dr. Dion Body covering 11/29/2018.  Gerald Leitz 11/28/2018, 3:36 PM

## 2018-11-28 NOTE — Lactation Note (Signed)
This note was copied from a baby's chart. Lactation Consultation Note  Patient Name: Natalie Zimmerman [id number] Date: 11/28/2018 Reason for consult: Initial assessment;Preterm <34wks;Infant < 6lbs;Primapara;1st time breastfeeding;NICU baby  Visited with mom of a 15 hours old < 5 lbs preterm NICU female who is being fed exclusively breast milk, baby is on donor milk now but mom is already pumping. She was very happy to report to [facility] that she already got some volume, 3 ml on her last pumping sessions (didn't get anything on the first two). Explained to mom that the purpose of pumping early on is mainly for breast stimulation and not to get volume, praised her for her efforts.   Mom is a P1, she attended online BF classes and she's already familiar with hand expression, when doing it with her RN she was able to see some colostrum. She's planning on buying a DEBP out of pocket; she also has insurance but she wants to have her pump right away, LC recommended a couple of brands. Reviewed pumping, breastmilk storage guidelines for NICU babies, supply and demand and feeding plan.   Feeding plan:  1. Encouraged mom to pump every 3 hours and at least once at night 2. Mom will continue taking her EBM to the NICU according to breastmilk storage guidelines  BF brochure and pumping log were reviewed. Mom reported all questions and concerns were answered, she's aware of LC services and will call PRN.  Maternal Data Formula Feeding for Exclusion: No Has patient been taught Hand Expression?: Yes Does the patient have breastfeeding experience prior to this delivery?: No  Feeding Feeding Type: Donor Breast Milk  Interventions Interventions: Breast feeding basics reviewed;DEBP  Lactation Tools Discussed/Used Tools: Pump Breast pump type: Double-Electric Breast Pump WIC Program: No Pump Review: Milk Storage;Setup, frequency, and cleaning Initiated by:: RN Date initiated:: 11/28/18   Consult  Status Consult Status: Follow-up Date: 11/29/18 Follow-up type: In-patient    Alisen Marsiglia Venetia Constable 11/28/2018, 1:00 PM

## 2018-11-28 NOTE — Progress Notes (Signed)
Pt in NICU

## 2018-11-28 NOTE — Anesthesia Postprocedure Evaluation (Signed)
Anesthesia Post Note  Patient: Natalie Zimmerman  Procedure(s) Performed: CESAREAN SECTION (N/A )     Patient location during evaluation: PACU Anesthesia Type: Spinal Level of consciousness: awake and alert Pain management: pain level controlled Vital Signs Assessment: post-procedure vital signs reviewed and stable Respiratory status: spontaneous breathing and respiratory function stable Cardiovascular status: blood pressure returned to baseline and stable Postop Assessment: spinal receding Anesthetic complications: no    Last Vitals:  Vitals:   11/27/18 2315 11/27/18 2350  BP:  127/67  Pulse: 85 85  Resp: 19 20  Temp:  36.6 C  SpO2: 100% 99%    Last Pain:  Vitals:   11/27/18 2350  TempSrc: Oral  PainSc: 0-No pain   Pain Goal: Patients Stated Pain Goal: 0 (11/27/18 1922)              Epidural/Spinal Function Cutaneous sensation: Tingles (11/27/18 2350), Patient able to flex knees: Yes (11/27/18 2350), Patient able to lift hips off bed: Yes (11/27/18 2350), Back pain beyond tenderness at insertion site: No (11/27/18 2350), Progressively worsening motor and/or sensory loss: No (11/27/18 2350), Bowel and/or bladder incontinence post epidural: No (11/27/18 2350)  Kennieth Rad

## 2018-11-28 NOTE — Lactation Note (Signed)
This note was copied from a baby's chart. Lactation Consultation Note  Patient Name: Natalie Zimmerman [id number] Date: 11/28/2018   [facility] Initial Visit:  Attempted to visit mother, however, she is not in her room at the present time.  LC will visit later today.   [place] Greb R [name] 11/28/2018, 10:01 AM

## 2018-11-29 NOTE — Progress Notes (Signed)
Subjective: Postop Day 2: Cesarean Delivery No complaints.  Pain controlled.  Lochia normal.  Breast feeding yes.  Baby doing well in NICU.  Objective: Temp:  [97.5 F (36.4 C)] 97.5 F (36.4 C) (03/20 0634) Pulse Rate:  [72] 72 (03/20 0634) Resp:  [17] 17 (03/20 0634) BP: (117)/(75) 117/75 (03/20 3570)  Physical Exam: Gen: NAD Lochia: Not visualized Uterine Fundus: firm, appropriately tender Incision: clean, dry and intact, healing well, old soilage on honeycomb dressing DVT Evaluation: minimal Edema present, no calf tenderness bilaterally   Recent Labs    11/27/18 1957 11/28/18 0430  HGB 11.1* 10.0*  HCT 34.2* 30.3*    Assessment/Plan: Status post C-section-doing well postoperatively. PT delivery. Continue routine post op care. Female baby stable in NICU.    Desires circumcision.  Consented. Plan for discharge tomorrow.    Geryl Rankins 11/29/2018, 5:29 PM

## 2018-11-29 NOTE — Lactation Note (Signed)
Lactation Consultation Note  Patient Name: Natalie Zimmerman [id number] Date: 11/29/2018    [facility] Follow Up Visit:  Attempted to visit with mother, however, she was in the NICU.                  [place]wood Adcock R Larue Drawdy 11/29/2018, 10:42 AM

## 2018-11-29 NOTE — Lactation Note (Signed)
Lactation Consultation Note  Patient Name: Natalie Zimmerman [id number] Date: 11/29/2018    [facility] Follow Up Visit:  Attempted to visit with mother, however, she was in the NICU.  I will return later.      Skyy Nilan R Haralambos Yeatts 11/29/2018, 8:16 AM

## 2018-11-29 NOTE — Progress Notes (Signed)
Patient screened out for psychosocial assessment since none of the following apply:  Psychosocial stressors documented in mother or baby's chart  Gestation less than 32 weeks  Code at delivery   Infant with anomalies Please contact the Clinical Social Worker if specific needs arise, by MOB's request, or if MOB scores greater than 9/yes to question 10 on Edinburgh Postpartum Depression Screen.  Kassidi Elza, LCSW Clinical Social Worker [facility] Cell#: [phone]     

## 2018-11-30 ENCOUNTER — Ambulatory Visit: Payer: Self-pay

## 2018-11-30 MED ORDER — IBUPROFEN 600 MG PO TABS: 600.0000 mg | ORAL_TABLET | Freq: Four times a day (QID) | ORAL | 0 refills | Status: DC | PRN

## 2018-11-30 MED ORDER — OXYCODONE HCL 5 MG PO TABS: 5.0000 mg | ORAL_TABLET | Freq: Four times a day (QID) | ORAL | 0 refills | Status: AC | PRN

## 2018-11-30 MED ORDER — PRENATAL PLUS 27-1 MG PO TABS: 1.0000 | ORAL_TABLET | Freq: Every day | ORAL | 0 refills | Status: DC

## 2018-11-30 NOTE — Discharge Instructions (Signed)
Cesarean Delivery °Cesarean birth, or cesarean delivery, is the surgical delivery of a baby through an incision in the abdomen and the uterus. This may be referred to as a C-section. This procedure may be scheduled ahead of time, or it may be done in an emergency situation. °Tell a health care provider about: °· Any allergies you have. °· All medicines you are taking, including vitamins, herbs, eye drops, creams, and over-the-counter medicines. °· Any problems you or family members have had with anesthetic medicines. °· Any blood disorders you have. °· Any surgeries you have had. °· Any medical conditions you have. °· Whether you or any members of your family have a history of deep vein thrombosis (DVT) or pulmonary embolism (PE). °What are the risks? °Generally, this is a safe procedure. However, problems may occur, including: °· Infection. °· Bleeding. °· Allergic reactions to medicines. °· Damage to other structures or organs. °· Blood clots. °· Injury to your baby. °What happens before the procedure? °General instructions °· Follow instructions from your health care provider about eating or drinking restrictions. °· If you know that you are going to have a cesarean delivery, do not shave your pubic area. Shaving before the procedure may increase your risk of infection. °· Plan to have someone take you home from the hospital. °· Ask your health care provider what steps will be taken to prevent infection. These may include: °? Removing hair at the surgery site. °? Washing skin with a germ-killing soap. °? Taking antibiotic medicine. °· Depending on the reason for your cesarean delivery, you may have a physical exam or additional testing, such as an ultrasound. °· You may have your blood or urine tested. °Questions for your health care provider °· Ask your health care provider about: °? Changing or stopping your regular medicines. This is especially important if you are taking diabetes medicines or blood  thinners. °? Your pain management plan. This is especially important if you plan to breastfeed your baby. °? How long you will be in the hospital after the procedure. °? Any concerns you may have about receiving blood products, if you need them during the procedure. °? Cord blood banking, if you plan to collect your baby's umbilical cord blood. °· You may also want to ask your health care provider: °? Whether you will be able to hold or breastfeed your baby while you are still in the operating room. °? Whether your baby can stay with you immediately after the procedure and during your recovery. °? Whether a family member or a person of your choice can go with you into the operating room and stay with you during the procedure, immediately after the procedure, and during your recovery. °What happens during the procedure? ° °· An IV will be inserted into one of your veins. °· Fluid and medicines, such as antibiotics, will be given before the surgery. °· Fetal monitors will be placed on your abdomen to check your baby's heart rate. °· You may be given a special warming gown to wear to keep your temperature stable. °· A catheter may be inserted into your bladder through your urethra. This drains your urine during the procedure. °· You may be given one or more of the following: °? A medicine to numb the area (local anesthetic). °? A medicine to make you fall asleep (general anesthetic). °? A medicine (regional anesthetic) that is injected into your back or through a small thin tube placed in your back (spinal anesthetic or epidural anesthetic).   This numbs everything below the injection site and allows you to stay awake during your procedure. If this makes you feel nauseous, tell your health care provider. Medicines will be available to help reduce any nausea you may feel.  An incision will be made in your abdomen, and then in your uterus.  If you are awake during your procedure, you may feel tugging and pulling in  your abdomen, but you should not feel pain. If you feel pain, tell your health care provider immediately.  Your baby will be removed from your uterus. You may feel more pressure or pushing while this happens.  Immediately after birth, your baby will be dried and kept warm. You may be able to hold and breastfeed your baby.  The umbilical cord may be clamped and cut during this time. This usually occurs after waiting a period of 1-2 minutes after delivery.  Your placenta will be removed from your uterus.  Your incisions will be closed with stitches (sutures). Staples, skin glue, or adhesive strips may also be applied to the incision in your abdomen.  Bandages (dressings) may be placed over the incision in your abdomen. The procedure may vary among health care providers and hospitals. What happens after the procedure?  Your blood pressure, heart rate, breathing rate, and blood oxygen level will be monitored until you are discharged from the hospital.  You may continue to receive fluids and medicines through an IV.  You will have some pain. Medicines will be available to help control your pain.  To help prevent blood clots: ? You may be given medicines. ? You may have to wear compression stockings or devices. ? You will be encouraged to walk around when you are able.  Hospital staff will encourage and support bonding with your baby. Your hospital may have you and your baby to stay in the same room (rooming in) during your hospital stay to encourage successful bonding and breastfeeding.  You may be encouraged to cough and breathe deeply often. This helps to prevent lung problems.  If you have a catheter draining your urine, it will be removed as soon as possible after your procedure. Summary  Cesarean birth, or cesarean delivery, is the surgical delivery of a baby through an incision in the abdomen and the uterus.  Follow instructions from your health care provider about eating or  drinking restrictions before the procedure.  You will have some pain after the procedure. Medicines will be available to help control your pain.  Hospital staff will encourage and support bonding with your baby after the procedure. Your hospital may have you and your baby to stay in the same room (rooming in) during your hospital stay to encourage successful bonding and breastfeeding. This information is not intended to replace advice given to you by your health care provider. Make sure you discuss any questions you have with your health care provider. Document Released: 08/28/2005 Document Revised: 03/04/2018 Document Reviewed: 03/04/2018 Elsevier Interactive Patient Education  2019 [employer].   Preterm Labor and Birth Information Pregnancy normally lasts 39-41 weeks. Preterm labor is when labor starts early. It starts before you have been pregnant for 37 whole weeks. What are the risk factors for preterm labor? Preterm labor is more likely to occur in women who:  Have an infection while pregnant.  Have a cervix that is short.  Have gone into preterm labor before.  Have had surgery on their cervix.  Are younger than age 82.  Are older than age  35.  Are African American.  Are pregnant with two or more babies.  Take street drugs while pregnant.  Smoke while pregnant.  Do not gain enough weight while pregnant.  Got pregnant right after another pregnancy. What are the symptoms of preterm labor? Symptoms of preterm labor include:  Cramps. The cramps may feel like the cramps some women get during their period. The cramps may happen with watery poop (diarrhea).  Pain in the belly (abdomen).  Pain in the lower back.  Regular contractions or tightening. It may feel like your belly is getting tighter.  Pressure in the lower belly that seems to get stronger.  More fluid (discharge) leaking from the vagina. The fluid may be watery or bloody.  Water breaking. Why is it  important to notice signs of preterm labor? Babies who are born early may not be fully developed. They have a higher chance for:  Long-term heart problems.  Long-term lung problems.  Trouble controlling body systems, like breathing.  Bleeding in the brain.  A condition called cerebral palsy.  Learning difficulties.  Death. These risks are highest for babies who are born before 34 weeks of pregnancy. How is preterm labor treated? Treatment depends on:  How long you were pregnant.  Your condition.  The health of your baby. Treatment may involve:  Having a stitch (suture) placed in your cervix. When you give birth, your cervix opens so the baby can come out. The stitch keeps the cervix from opening too soon.  Staying at the hospital.  Taking or getting medicines, such as: ? Hormone medicines. ? Medicines to stop contractions. ? Medicines to help the babys lungs develop. ? Medicines to prevent your baby from having cerebral palsy. What should I do if I am in preterm labor? If you think you are going into labor too soon, call your doctor right away. How can I prevent preterm labor?  Do not use any tobacco products. ? Examples of these are cigarettes, chewing tobacco, and e-cigarettes. ? If you need help quitting, ask your doctor.  Do not use street drugs.  Do not use any medicines unless you ask your doctor if they are safe for you.  Talk with your doctor before taking any herbal supplements.  Make sure you gain enough weight.  Watch for infection. If you think you might have an infection, get it checked right away.  If you have gone into preterm labor before, tell your doctor. This information is not intended to replace advice given to you by your health care provider. Make sure you discuss any questions you have with your health care provider. Document Released: 11/24/2008 Document Revised: 02/08/2016 Document Reviewed: 01/19/2016 Elsevier Interactive Patient  Education  2019 [employer].   Caring for Your Premature Infant at Home A premature infant is a baby that is born early, before 37 weeks of pregnancy. Babies who are born early are more likely to develop certain problems and complications. Because of this, they may need extra care at home. What kinds of problems is my infant at risk for? Babies who are born early are at risk for certain problems, including:  Breathing problems.  Low birth weight.  Feeding problems.  Sleeping problems.  Yellowing of the skin (jaundice).  Infections such as pneumonia. The earlier your baby is born, the more likely he or she is to have these problems. Babies born very early are at risk for more serious problems, including:  Severe breathing problems.  Eyesight problems.  Brain  development concerns.  Behavioral and emotional development concerns.  Growth and developmental delays.  Cerebral palsy.  Severe feeding problems, or problems passing stool. Follow these instructions at home: Caring for your infant  Prior to going home with your premature infant, understand your infant's conditions and needs.  Follow all your baby's health care provider's instructions for providing support and care to your preterm infant.  Bond with your infant as much as possible by holding, rocking, and cuddling. This can be skin to skin contact.  Keep your infant warm. Dress your infant in layers and keep him or her away from drafts, especially in cold months of the year. Safety   Consider learning infant CPR.  When driving, monitor your infant in the car seat until he or she grows and matures. It is important to do this because preterm infants may have problems with their airway when in an infant car seat. A small rolled diaper or blanket between the crotch strap and the infant may be added to help keep them in a safe position.  Place your baby to sleep on his or her back unless your baby's health care  provider has told you not to do so. This is the best and most important way you can lower the risk of sudden infant death syndrome (SIDS).  Monitor your infant when he or she is feeding for any changes in skin color or problems breathing. Premature infants may have problems coordinating sucking, swallowing, and breathing. General instructions   Wash your hands thoroughly after going to the bathroom or changing a diaper. Preterm infants may be more prone to infection. Use soap and water or hand sanitizer if soap and water are not available.  Consider joining a support group in order to get help from organizations and groups that understand your challenges.  Keep all follow-up visits as told by your childs health care provider. This is important. Where to find more information  [employer]: www.marchofdimes.com  Prematurity.org: www.prematurity.org Contact a health care provider if:  Your infant has trouble feeding.  Your infant has trouble sleeping.  Your infant develops jaundice.  Your infant shows signs of infection, such as a fever or yellow or green nasal mucus.  You are not able to console your baby when he or she cries. Get help right away if:  Your infant has a bluish color to his or her skin.  Your infant has trouble breathing.  Your infant who is younger than 3 months has a temperature of 100F (38C) or higher. Summary  A premature infant is a baby that is born early, before 37 weeks of pregnancy.  Babies who are born early are more likely to develop certain problems and complications.  Prior to going home with your premature infant, understand your infant's conditions and needs.  Get support from organizations and groups that understand your challenges. Consider joining a support group. This information is not intended to replace advice given to you by your health care provider. Make sure you discuss any questions you have with your health care  provider. Document Released: 11/18/2003 Document Revised: 10/25/2016 Document Reviewed: 10/25/2016 Elsevier Interactive Patient Education  2019 [employer].  Postpartum Baby Blues The postpartum period begins right after the birth of a baby. During this time, there is often a lot of joy and excitement. It is also a time of many changes in the life of the parents. No matter how many times a mother gives birth, each child brings new challenges  to the family, including different ways of relating to one another. It is common to have feelings of excitement along with confusing changes in moods, emotions, and thoughts. You may feel happy one minute and sad or stressed the next. These feelings of sadness usually happen in the period right after you have your baby, and they go away within a week or two. This is called the "baby blues." What are the causes? There is no known cause of baby blues. It is likely caused by a combination of factors. However, changes in hormone levels after childbirth are believed to trigger some of the symptoms. Other factors that can play a role in these mood changes include:  Lack of sleep.  Stressful life events, such as poverty, caring for a loved one, or death of a loved one.  Genetics. What are the signs or symptoms? Symptoms of this condition include:  Brief changes in mood, such as going from extreme happiness to sadness.  Decreased concentration.  Difficulty sleeping.  Crying spells and tearfulness.  Loss of appetite.  Irritability.  Anxiety. If the symptoms of baby blues last for more than 2 weeks or become more severe, you may have postpartum depression. How is this diagnosed? This condition is diagnosed based on an evaluation of your symptoms. There are no medical or lab tests that lead to a diagnosis, but there are various questionnaires that a health care provider may use to identify women with the baby blues or postpartum depression. How is this  treated? Treatment is not needed for this condition. The baby blues usually go away on their own in 1-2 weeks. Social support is often all that is needed. You will be encouraged to get adequate sleep and rest. Follow these instructions at home: Lifestyle      Get as much rest as you can. Take a nap when the baby sleeps.  Exercise regularly as told by your health care provider. Some women find yoga and walking to be helpful.  Eat a balanced and nourishing diet. This includes plenty of fruits and vegetables, whole grains, and lean proteins.  Do little things that you enjoy. Have a cup of tea, take a bubble bath, read your favorite magazine, or listen to your favorite music.  Avoid alcohol.  Ask for help with household chores, cooking, grocery shopping, or running errands. Do not try to do everything yourself. Consider hiring a postpartum doula to help. This is a professional who specializes in providing support to new mothers.  Try not to make any major life changes during pregnancy or right after giving birth. This can add stress. General instructions  Talk to people close to you about how you are feeling. Get support from your partner, family members, friends, or other new moms. You may want to join a support group.  Find ways to cope with stress. This may include: ? Writing your thoughts and feelings in a journal. ? Spending time outside. ? Spending time with people who make you laugh.  Try to stay positive in how you think. Think about the things you are grateful for.  Take over-the-counter and prescription medicines only as told by your health care provider.  Let your health care provider know if you have any concerns.  Keep all postpartum visits as told by your health care provider. This is important. Contact a health care provider if:  Your baby blues do not go away after 2 weeks. Get help right away if:  You have thoughts of taking  your own life (suicidal  thoughts).  You think you may harm the baby or other people.  You see or hear things that are not there (hallucinations). Summary  After giving birth, you may feel happy one minute and sad or stressed the next. Feelings of sadness that happen right after the baby is born and go away after a week or two are called the "baby blues."  You can manage the baby blues by getting enough rest, eating a healthy diet, exercising, spending time with supportive people, and finding ways to cope with stress.  If feelings of sadness and stress last longer than 2 weeks or get in the way of caring for your baby, talk to your health care provider. This may mean you have postpartum depression. This information is not intended to replace advice given to you by your health care provider. Make sure you discuss any questions you have with your health care provider. Document Released: 06/01/2004 Document Revised: 10/24/2016 Document Reviewed: 10/24/2016 Elsevier Interactive Patient Education  2019 [employer].

## 2018-11-30 NOTE — Discharge Summary (Signed)
OB Discharge Summary     Patient Name: Natalie Zimmerman DOB: 1991-06-12 MRN: [id number]  Date of admission: 11/27/2018 Delivering MD: Myna Hidalgo   Date of discharge: 11/30/2018  Admitting diagnosis: BLEEDING Intrauterine pregnancy: [redacted]w[redacted]d     Secondary diagnosis:  Active Problems:   Preterm labor     Discharge diagnosis: Preterm Pregnancy Delivered                                                                                                Post partum procedures:n/a  Augmentation: n/a  Complications: None  Hospital course:  Onset of Labor With Unplanned C/S  28 y.o. yo G1P0101 at [redacted]w[redacted]d was admitted in Active Labor on 11/27/2018. Patient had a labor course significant for preterm labor with footling breech. Membrane Rupture Time/Date: 9:25 PM ,11/27/2018   The patient went for cesarean section due to Malpresentation, and delivered a Viable infant,11/27/2018  Details of operation can be found in separate operative note. Patient had an uncomplicated postpartum course.  She is ambulating,tolerating a regular diet, passing flatus, and urinating well.  Patient is discharged home in stable condition 11/30/18.  Physical exam  Vitals:   11/29/18 0634 11/29/18 2124 11/29/18 2233 11/30/18 0546  BP: 117/75 128/80 117/64 107/70  Pulse: 72 67 74 71  Resp: 17 16 15    Temp: (!) 97.5 F (36.4 C) 97.8 F (36.6 C) 98.1 F (36.7 C) 98.1 F (36.7 C)  TempSrc: Oral  Oral Oral  SpO2:  100% 100% 100%  Weight:      Height:       General: alert and cooperative Lochia: appropriate Uterine Fundus: firm Incision: Healing well with no significant drainage, No significant erythema, Dressing is clean, dry, and intact DVT Evaluation: No evidence of DVT seen on physical exam. Negative Homan's sign. No cords or calf tenderness. No significant calf/ankle edema. Labs: Lab Results  Component Value Date   WBC 18.0 (H) 11/28/2018   HGB 10.0 (L) 11/28/2018   HCT 30.3 (L) 11/28/2018   MCV 89.4  11/28/2018   PLT 266 11/28/2018   CMP Latest Ref Rng & Units 11/27/2018  Glucose 70 - 99 mg/dL 95  BUN 6 - 20 mg/dL 6  Creatinine 9.50 - 7.22 mg/dL 5.75  Sodium 051 - 833 mmol/L 136  Potassium 3.5 - 5.1 mmol/L 3.9  Chloride 98 - 111 mmol/L 106  CO2 22 - 32 mmol/L 19(L)  Calcium 8.9 - 10.3 mg/dL 9.1  Total Protein 6.5 - 8.1 g/dL 6.4(L)  Total Bilirubin 0.3 - 1.2 mg/dL 0.4  Alkaline Phos 38 - 126 U/L 96  AST 15 - 41 U/L 20  ALT 0 - 44 U/L 16    Discharge instruction: per After Visit Summary and "Baby and Me Booklet".  After visit meds:  Allergies as of 11/30/2018   No Known Allergies     Medication List    STOP taking these medications   sucralfate 1 g tablet Commonly known as:  Carafate     TAKE these medications   ibuprofen 600 MG tablet Commonly known as:  ADVIL,MOTRIN Take 1 tablet (600 mg  total) by mouth every 6 (six) hours as needed for moderate pain.   oxyCODONE 5 MG immediate release tablet Commonly known as:  Oxy IR/ROXICODONE Take 1 tablet (5 mg total) by mouth every 6 (six) hours as needed for up to 7 days for severe pain.   prenatal vitamin w/FE, FA 27-1 MG Tabs tablet Take 1 tablet by mouth daily at 12 noon. What changed:  how much to take       Diet: routine diet  Activity: Advance as tolerated. Pelvic rest for 6 weeks.   Outpatient follow up:6 weeks Follow up Appt:No future appointments. Follow up Visit:No follow-ups on file.  Postpartum contraception: Undecided  Newborn Data: Live born female  Birth Weight:   APGAR: 8, 9  Newborn Delivery   Birth date/time:  11/27/2018 21:27:00 Delivery type:  C-Section, Low Transverse Trial of labor:  No C-section categorization:  Primary     Baby Feeding: Breast Disposition:NICU   11/30/2018 Janeece Riggers, CNM

## 2018-11-30 NOTE — Lactation Note (Signed)
This note was copied from a baby's chart. Lactation Consultation Note  Patient Name: Natalie Zimmerman [id number] Date: 11/30/2018 Reason for consult: Follow-up assessment;Preterm <34wks;Infant < 6lbs;Primapara;1st time breastfeeding;NICU baby(mom for D/C today - see LC note ) Per mom has pumped every 3 hours the last 24 hours and is getting increased colostrum and also on power pump last night.  LC reviewed supply and demand/ importance of continuing to be consistent with her pumping 8-10 times a day both breast for 15 -20 mins/ save milk for baby. Also consider hand expressing prior to pumping and after pumping to enhance let down.  Per mom hasn't had baby STS yet . LC encouraged mom to ask her NICU when she will be able to.  Mom denies soreness / sore nipple and engorgement prevention and tx.  Mom has her DEBP kit to pump in NICU and plans to buy a DEBP pump for home use. Reviewed storage of breast milk for  NICU and when she takes baby home.  LC reassured mom it is a slow process when pumping / like a roller coaster.  LC stressed the importance of naps and rest/ plenty of fluids / nutritious calories.  LC provided the NICU breast feeding booklet and the Mother and Baby care booklet.  Mother informed of post-discharge support and given phone number to the lactation department, including services for phone call assistance; out-patient appointments; and breastfeeding support group. List of other breastfeeding resources in the community given in the handout. Encouraged mother to call for problems or concerns related to breastfeeding.  Maternal Data Has patient been taught Hand Expression?: Yes(LC reviewed and recommended prior to pumping and after pumping to enhance let down )  Feeding Feeding Type: Donor Breast Milk  LATCH Score                   Interventions Interventions: Breast feeding basics reviewed;DEBP  Lactation Tools Discussed/Used Tools: Pump;Flanges Flange Size:  24;Other (comment)(mom aware she has the #27 Flanges if needed when the milk comes in ) Breast pump type: Double-Electric Breast Pump Pump Review: Milk Storage   Consult Status Consult Status: PRN Follow-up type: Other (comment)(in NICU )    Jerlyn Ly [facility] 11/30/2018, 11:33 AM

## 2018-12-07 DIAGNOSIS — Q211 Atrial septal defect: Secondary | ICD-10-CM | POA: Diagnosis not present

## 2018-12-08 ENCOUNTER — Ambulatory Visit: Payer: Self-pay

## 2018-12-08 NOTE — Lactation Note (Signed)
This note was copied from a baby's chart. Lactation Consultation Note  Patient Name: Natalie Zimmerman [id number] Date: 12/08/2018   Mom has no questions for me about pumping at this time. Mom has 2 basins, dishwashing liquid, and a clean toothbrush for cleaning pump parts.The hand-out from the CDC, "How to Keep Your Breast Pump Kit Clean," was provided to Mom. Different methods of sanitizing the pump parts were discussed with Mom.  Matthias Hughs [facility] 12/08/2018, 3:09 PM

## 2018-12-08 NOTE — Lactation Note (Signed)
This note was copied from a baby's chart. Lactation Consultation Note  Patient Name: Natalie Zimmerman [id number] Date: 12/08/2018   Infant has a CGA of 35 weeks. Mom seemed disappointed that infant was not feeding at the breast. I explained that at this gestational age, you could expect infant to perhaps lick nipple, latch briefly (which Mom affirms he does), but the ability to be more productive/transfer milk from the breast tends not to be until closer to the infant's original due date.   I offered to return at 3pm feeding, but Mom is thinking that she may not need me for a feeding assist. However, parents did give me permission to return to go over washing breast pump parts.  Lurline Hare [facility] 12/08/2018, 11:39 AM

## 2018-12-11 ENCOUNTER — Ambulatory Visit: Payer: Self-pay

## 2018-12-11 NOTE — Lactation Note (Signed)
This note was copied from a baby's chart. Lactation Consultation Note  Patient Name: Natalie Zimmerman [id number] Date: 12/11/2018 Reason for consult: Follow-up assessment;NICU baby;Preterm <34wks;Primapara;1st time breastfeeding;Infant < 6lbs  P1 mother whose infant is now 12 weeks old.  This is a preterm baby born at 33+3 weeks with a corrected gestational age of 35+3 weeks weighing < 6 lbs.  RN requested latch assistance.  Baby awake, alert and showing feeding cues when I arrived.  Mother's breasts are full and leaking milk.  Upon my gloved finger baby was able to suck well.  He has a lingual frenulum.  Attempted to latch him to the left breast without the NS.  He was quite hungry and became very restless at the breast.  After 2 attempts, suggested mother use the NS and she was willing to try.  Instructed her on how to properly place the #20 NS.  Assisted baby to latch on the left breast in the football hold without difficulty.  Initially, he was restless but settled down quickly and began rhythmically sucking.  Breast compressions demonstrated and audible swallows noted.  Demonstrated techniques to keep him sucking actively and reminded mother that short pauses are expected.  Gentle stimulation used effectively.  Observed baby feeding for 14 minutes before he self released.  Mother very pleased to see him feeding well and she acknowledged hearing him swallow.  She had no questions about pumping and will continue to pump with the DEBP after feedings.  She will speak with her RN regarding milk volumes.  Reminded mother that her RN can call for latch assistance as needed when mother visits baby.  RN updated.   Maternal Data Formula Feeding for Exclusion: No Has patient been taught Hand Expression?: Yes Does the patient have breastfeeding experience prior to this delivery?: No  Feeding Feeding Type: Breast Fed  LATCH Score Latch: Grasps breast easily, tongue down, lips flanged, rhythmical  sucking.(#20 NS)  Audible Swallowing: Spontaneous and intermittent  Type of Nipple: Everted at rest and after stimulation  Comfort (Breast/Nipple): Soft / non-tender  Hold (Positioning): Assistance needed to correctly position infant at breast and maintain latch.  LATCH Score: 9  Interventions Interventions: Breast feeding basics reviewed;Assisted with latch;Skin to skin;Breast massage;Hand express;Breast compression;Expressed milk;Position options;Support pillows;Adjust position  Lactation Tools Discussed/Used WIC Program: No   Consult Status Consult Status: PRN Date: 12/11/18 Follow-up type: Call as needed    Natalie Zimmerman Natalie Zimmerman 12/11/2018, 6:22 PM

## 2019-09-12 DIAGNOSIS — G35D Multiple sclerosis, unspecified: Secondary | ICD-10-CM

## 2019-09-12 DIAGNOSIS — G35 Multiple sclerosis: Secondary | ICD-10-CM

## 2019-09-12 HISTORY — DX: Multiple sclerosis: G35

## 2019-09-12 HISTORY — DX: Multiple sclerosis, unspecified: G35.D

## 2020-06-01 DIAGNOSIS — Z23 Encounter for immunization: Secondary | ICD-10-CM | POA: Diagnosis not present

## 2020-07-06 ENCOUNTER — Encounter (HOSPITAL_COMMUNITY): Payer: Self-pay

## 2020-07-06 ENCOUNTER — Emergency Department (HOSPITAL_COMMUNITY): Payer: BC Managed Care – PPO

## 2020-07-06 ENCOUNTER — Inpatient Hospital Stay (HOSPITAL_COMMUNITY)
Admission: EM | Admit: 2020-07-06 | Discharge: 2020-07-10 | DRG: 059 | Disposition: A | Discharge status: Home / Self Care | Payer: BC Managed Care – PPO | Attending: Internal Medicine | Admitting: Internal Medicine

## 2020-07-06 ENCOUNTER — Other Ambulatory Visit: Payer: Self-pay

## 2020-07-06 DIAGNOSIS — Z3490 Encounter for supervision of normal pregnancy, unspecified, unspecified trimester: Secondary | ICD-10-CM

## 2020-07-06 DIAGNOSIS — Z8249 Family history of ischemic heart disease and other diseases of the circulatory system: Secondary | ICD-10-CM

## 2020-07-06 DIAGNOSIS — Z3201 Encounter for pregnancy test, result positive: Secondary | ICD-10-CM | POA: Diagnosis not present

## 2020-07-06 DIAGNOSIS — R739 Hyperglycemia, unspecified: Secondary | ICD-10-CM | POA: Diagnosis not present

## 2020-07-06 DIAGNOSIS — R519 Headache, unspecified: Secondary | ICD-10-CM | POA: Diagnosis not present

## 2020-07-06 DIAGNOSIS — E282 Polycystic ovarian syndrome: Secondary | ICD-10-CM | POA: Diagnosis not present

## 2020-07-06 DIAGNOSIS — T380X5A Adverse effect of glucocorticoids and synthetic analogues, initial encounter: Secondary | ICD-10-CM | POA: Diagnosis not present

## 2020-07-06 DIAGNOSIS — G35 Multiple sclerosis: Principal | ICD-10-CM | POA: Diagnosis present

## 2020-07-06 DIAGNOSIS — Z20822 Contact with and (suspected) exposure to covid-19: Secondary | ICD-10-CM | POA: Diagnosis not present

## 2020-07-06 DIAGNOSIS — H532 Diplopia: Secondary | ICD-10-CM | POA: Diagnosis not present

## 2020-07-06 DIAGNOSIS — G379 Demyelinating disease of central nervous system, unspecified: Secondary | ICD-10-CM | POA: Diagnosis present

## 2020-07-06 DIAGNOSIS — H538 Other visual disturbances: Secondary | ICD-10-CM | POA: Diagnosis not present

## 2020-07-06 DIAGNOSIS — Z809 Family history of malignant neoplasm, unspecified: Secondary | ICD-10-CM | POA: Diagnosis not present

## 2020-07-06 DIAGNOSIS — G35A Relapsing-remitting multiple sclerosis: Secondary | ICD-10-CM | POA: Diagnosis present

## 2020-07-06 DIAGNOSIS — N390 Urinary tract infection, site not specified: Secondary | ICD-10-CM | POA: Diagnosis present

## 2020-07-06 DIAGNOSIS — Z3A00 Weeks of gestation of pregnancy not specified: Secondary | ICD-10-CM | POA: Diagnosis not present

## 2020-07-06 DIAGNOSIS — R8271 Bacteriuria: Secondary | ICD-10-CM | POA: Diagnosis not present

## 2020-07-06 DIAGNOSIS — O99891 Other specified diseases and conditions complicating pregnancy: Secondary | ICD-10-CM | POA: Diagnosis not present

## 2020-07-06 LAB — COMPREHENSIVE METABOLIC PANEL
ALT: 15 U/L (ref 0–44)
AST: 17 U/L (ref 15–41)
Albumin: 4.5 g/dL (ref 3.5–5.0)
Alkaline Phosphatase: 48 U/L (ref 38–126)
Anion gap: 8 (ref 5–15)
BUN: 15 mg/dL (ref 6–20)
CO2: 22 mmol/L (ref 22–32)
Calcium: 9.1 mg/dL (ref 8.9–10.3)
Chloride: 107 mmol/L (ref 98–111)
Creatinine, Ser: 0.76 mg/dL (ref 0.44–1.00)
GFR, Estimated: >60 mL/min (ref >60)
Glucose, Bld: 98 mg/dL (ref 70–99)
Potassium: 3.6 mmol/L (ref 3.5–5.1)
Sodium: 137 mmol/L (ref 135–145)
Total Bilirubin: 0.8 mg/dL (ref 0.3–1.2)
Total Protein: 7.8 g/dL (ref 6.5–8.1)

## 2020-07-06 LAB — URINALYSIS, ROUTINE W REFLEX MICROSCOPIC
Bilirubin Urine: NEGATIVE
Glucose, UA: NEGATIVE mg/dL
Ketones, ur: NEGATIVE mg/dL
Nitrite: NEGATIVE
Protein, ur: NEGATIVE mg/dL
Specific Gravity, Urine: 1.008 (ref 1.005–1.030)
WBC, UA: >50 WBC/hpf — ABNORMAL HIGH (ref 0–5)
pH: 6 (ref 5.0–8.0)

## 2020-07-06 LAB — I-STAT BETA HCG BLOOD, ED (MC, WL, AP ONLY): I-stat hCG, quantitative: >2000 m[IU]/mL — ABNORMAL HIGH (ref <5)

## 2020-07-06 LAB — CBC
HCT: 39.5 % (ref 36.0–46.0)
Hemoglobin: 13 g/dL (ref 12.0–15.0)
MCH: 30.1 pg (ref 26.0–34.0)
MCHC: 32.9 g/dL (ref 30.0–36.0)
MCV: 91.4 fL (ref 80.0–100.0)
Platelets: 290 10*3/uL (ref 150–400)
RBC: 4.32 MIL/uL (ref 3.87–5.11)
RDW: 12.9 % (ref 11.5–15.5)
WBC: 8.7 10*3/uL (ref 4.0–10.5)
nRBC: 0 % (ref 0.0–0.2)

## 2020-07-06 LAB — RESPIRATORY PANEL BY RT PCR (FLU A&B, COVID)
Influenza A by PCR: NEGATIVE
Influenza B by PCR: NEGATIVE
SARS Coronavirus 2 by RT PCR: NEGATIVE

## 2020-07-06 LAB — HCG, QUANTITATIVE, PREGNANCY: hCG, Beta Chain, Quant, S: 15602 m[IU]/mL — ABNORMAL HIGH (ref <5)

## 2020-07-06 MED ORDER — PRENATAL PLUS 27-1 MG PO TABS
1.0000 | ORAL_TABLET | Freq: Every day | ORAL | Status: DC
  Administered 2020-07-07 – 2020-07-10 (×4): 1 via ORAL
  Filled 2020-07-06 (×4): qty 1

## 2020-07-06 MED ORDER — ENOXAPARIN SODIUM 40 MG/0.4ML ~~LOC~~ SOLN
40.0000 mg | SUBCUTANEOUS | Status: DC
  Administered 2020-07-07 – 2020-07-10 (×3): 40 mg via SUBCUTANEOUS
  Filled 2020-07-06 (×4): qty 0.4

## 2020-07-06 MED ORDER — SODIUM CHLORIDE 0.9 % IV SOLN
1000.0000 mg | Freq: Once | INTRAVENOUS | Status: AC
  Administered 2020-07-06: 1000 mg via INTRAVENOUS
  Filled 2020-07-06: qty 8

## 2020-07-06 MED ORDER — ZOLPIDEM TARTRATE 5 MG PO TABS: 5.0000 mg | ORAL_TABLET | Freq: Every evening | ORAL | Status: DC | PRN

## 2020-07-06 MED ORDER — SODIUM CHLORIDE 0.9% FLUSH
3.0000 mL | Freq: Two times a day (BID) | INTRAVENOUS | Status: DC
  Administered 2020-07-07 – 2020-07-09 (×5): 3 mL via INTRAVENOUS

## 2020-07-06 MED ORDER — SODIUM CHLORIDE 0.9% FLUSH: 3.0000 mL | INTRAVENOUS | Status: DC | PRN

## 2020-07-06 MED ORDER — IBUPROFEN 400 MG PO TABS: 600.0000 mg | ORAL_TABLET | Freq: Four times a day (QID) | ORAL | Status: DC | PRN

## 2020-07-06 MED ORDER — GADOBUTROL 1 MMOL/ML IV SOLN
7.0000 mL | Freq: Once | INTRAVENOUS | Status: AC | PRN
  Administered 2020-07-06: 7 mL via INTRAVENOUS

## 2020-07-06 MED ORDER — ACETAMINOPHEN 325 MG PO TABS
650.0000 mg | ORAL_TABLET | Freq: Four times a day (QID) | ORAL | Status: DC | PRN
  Administered 2020-07-09: 650 mg via ORAL
  Filled 2020-07-06: qty 2

## 2020-07-06 MED ORDER — SODIUM CHLORIDE 0.9 % IV SOLN
1000.0000 mg | Freq: Every day | INTRAVENOUS | Status: AC
  Administered 2020-07-07 – 2020-07-10 (×4): 1000 mg via INTRAVENOUS
  Filled 2020-07-06 (×6): qty 8

## 2020-07-06 MED ORDER — ACETAMINOPHEN 650 MG RE SUPP: 650.0000 mg | Freq: Four times a day (QID) | RECTAL | Status: DC | PRN

## 2020-07-06 MED ORDER — LORAZEPAM 2 MG/ML IJ SOLN
0.5000 mg | Freq: Once | INTRAMUSCULAR | Status: AC
  Administered 2020-07-06: 0.5 mg via INTRAVENOUS
  Filled 2020-07-06: qty 1

## 2020-07-06 MED ORDER — LORAZEPAM 2 MG/ML IJ SOLN: 0.5000 mg | Freq: Four times a day (QID) | INTRAMUSCULAR | Status: DC | PRN

## 2020-07-06 MED ORDER — SODIUM CHLORIDE 0.9 % IV SOLN: 250.0000 mL | INTRAVENOUS | Status: DC | PRN

## 2020-07-06 NOTE — ED Notes (Signed)
Pt transported to MRI 

## 2020-07-06 NOTE — H&P (Signed)
History and Physical    Modene Andy ZTI:[id number] DOB: [birth year] DOA: 07/06/2020  PCP: Patient, No Pcp Per (Confirm with patient/family/NH records and if not entered, this has to be entered at [facility] point of entry) Patient coming from: home  I have personally briefly reviewed patient's old medical records in [facility]  Chief Complaint: double vision  HPI: Natalie Zimmerman is a 29 y.o. female with medical history significant of polycystic ovary disease presents today with 5 days of diplopia.  Patient states she noted some blurring of vision when looking to the left last Thursday then noted double vision in all visual fields worse with distance.  This has been constant.  She has had some headache waxing and waning since the symptoms began.  She describes it as being to the left side of her forehead.  She took ibuprofen this afternoon with some mild relief.  Pain is moderate.  She does not typically have headaches.  She denies fever, chills, neck pain, nausea, vomiting, diarrhea.  She works in a [employer].  She was seen by her optomatrist yesterday and they thought it might be secondary to her prescription.  At her pediatricians office, a friend and co-worker had office RN see her who noticed on exam that her eyes were not tracking normally.  She was advised to come to the ED for further evaluation.  ED Course: T 98,2  125/76  HR 102  RR 17. On ED-P exam she had decrease abduction left eye. Cmet -nl, CBC - nl, U/A with many squamous cells and bacturia ( not clean catch). BHCG positive. COVID-19 NEGATIVE. MRI with demylenating lesions, also of the C-Spine. Dr. Orpah Clinton, hospital neurologist, was consulted, who recommended admission and high dose IV steroids. [facility] called to admit patient.   Review of Systems: As per HPI otherwise 10 point review of systems negative.    History reviewed. No pertinent past medical history.  Past Surgical History:  Procedure Laterality Date  . CESAREAN  SECTION N/A 11/27/2018   Procedure: CESAREAN SECTION;  Surgeon: Myna Hidalgo, DO;  Location: [facility] LD ORS;  Service: Obstetrics;  Laterality: N/A;    Social Hx - lives with SO. Has a child 93 months old. Was working at [employer] but left work in June to full-time parent.   reports that she has never smoked. She has never used smokeless tobacco. She reports that she does not drink alcohol and does not use drugs.  No Known Allergies  Family History  Problem Relation Age of Onset  . Cancer Maternal Grandmother   . Heart disease Maternal Grandfather   . Cancer Paternal Grandfather   . Cancer Maternal Aunt   . Cancer Paternal Aunt      Prior to Admission medications   Medication Sig Start Date End Date Taking? Authorizing Provider  ibuprofen (ADVIL,MOTRIN) 600 MG tablet Take 1 tablet (600 mg total) by mouth every 6 (six) hours as needed for moderate pain. 11/30/18  Yes Janeece Riggers, CNM  prenatal vitamin w/FE, FA (PRENATAL 1 + 1) 27-1 MG TABS tablet Take 1 tablet by mouth daily at 12 noon. Patient not taking: Reported on 07/06/2020 11/30/18   Janeece Riggers, CNM    Physical Exam: Vitals:   07/06/20 1728 07/06/20 2000 07/06/20 2100 07/06/20 2200  BP: 137/81 117/80 121/79 123/76  Pulse:  (!) 111 100 (!) 102  Resp:  16 16 17   Temp:      TempSrc:      SpO2:  98% 97% 98%  Weight:      Height:         Vitals:   07/06/20 1728 07/06/20 2000 07/06/20 2100 07/06/20 2200  BP: 137/81 117/80 121/79 123/76  Pulse:  (!) 111 100 (!) 102  Resp:  16 16 17   Temp:      TempSrc:      SpO2:  98% 97% 98%  Weight:      Height:       General: WNWD woman in no distress but a little surprised to find herself here. Eyes: PERRL, lids and conjunctivae normal ENMT: Mucous membranes are moist. Posterior pharynx clear of any exudate or lesions.Normal dentition.  Neck: normal, supple, no masses, no thyromegaly Respiratory: clear to auscultation bilaterally, no wheezing, no crackles. Normal  respiratory effort. No accessory muscle use.  Cardiovascular: Regular rate and rhythm, no murmurs / rubs / gallops. No extremity edema. 2+ pedal pulses. No carotid bruits.  Abdomen: no tenderness, no masses palpated. No hepatosplenomegaly. Bowel sounds positive.  Musculoskeletal: no clubbing / cyanosis. No joint deformity upper and lower extremities. Good ROM, no contractures. Normal muscle tone.  Skin: no rashes, lesions, ulcers. No induration Neurologic: CN 2-12  Subtle decreased abduction left eye. Sensation intact w/o paresthesia or loss of feeling, DTR normal except decreased at right patellar tendon. Strength 5/5 in all 4.  Psychiatric: Normal judgment and insight. Alert and oriented x 3. Normal mood.     Labs on Admission: I have personally reviewed following labs and imaging studies  CBC: Recent Labs  Lab 07/06/20 1731  WBC 8.7  HGB 13.0  HCT 39.5  MCV 91.4  PLT 290   Basic Metabolic Panel: Recent Labs  Lab 07/06/20 1731  NA 137  K 3.6  CL 107  CO2 22  GLUCOSE 98  BUN 15  CREATININE 0.76  CALCIUM 9.1   GFR: Estimated Creatinine Clearance: 93 mL/min (by C-G formula based on SCr of 0.76 mg/dL). Liver Function Tests: Recent Labs  Lab 07/06/20 1731  AST 17  ALT 15  ALKPHOS 48  BILITOT 0.8  PROT 7.8  ALBUMIN 4.5   No results for input(s): LIPASE, AMYLASE in the last 168 hours. No results for input(s): AMMONIA in the last 168 hours. Coagulation Profile: No results for input(s): INR, PROTIME in the last 168 hours. Cardiac Enzymes: No results for input(s): CKTOTAL, CKMB, CKMBINDEX, TROPONINI in the last 168 hours. BNP (last 3 results) No results for input(s): PROBNP in the last 8760 hours. HbA1C: No results for input(s): HGBA1C in the last 72 hours. CBG: No results for input(s): GLUCAP in the last 168 hours. Lipid Profile: No results for input(s): CHOL, HDL, LDLCALC, TRIG, CHOLHDL, LDLDIRECT in the last 72 hours. Thyroid Function Tests: No results for  input(s): TSH, T4TOTAL, FREET4, T3FREE, THYROIDAB in the last 72 hours. Anemia Panel: No results for input(s): VITAMINB12, FOLATE, FERRITIN, TIBC, IRON, RETICCTPCT in the last 72 hours. Urine analysis:    Component Value Date/Time   COLORURINE YELLOW 07/06/2020 1731   APPEARANCEUR CLOUDY (A) 07/06/2020 1731   LABSPEC 1.008 07/06/2020 1731   PHURINE 6.0 07/06/2020 1731   GLUCOSEU NEGATIVE 07/06/2020 1731   HGBUR MODERATE (A) 07/06/2020 1731   BILIRUBINUR NEGATIVE 07/06/2020 1731   BILIRUBINUR neg 04/27/2015 1255   KETONESUR NEGATIVE 07/06/2020 1731   PROTEINUR NEGATIVE 07/06/2020 1731   UROBILINOGEN 0.2 04/27/2015 1255   NITRITE NEGATIVE 07/06/2020 1731   LEUKOCYTESUR LARGE (A) 07/06/2020 1731    Radiological Exams on Admission: MR Brain  W and Wo Contrast  Result Date: 07/06/2020 CLINICAL DATA:  Blurry vision and headache EXAM: MRI HEAD AND ORBITS WITHOUT AND WITH CONTRAST TECHNIQUE: Multiplanar, multiecho pulse sequences of the brain and surrounding structures were obtained without and with intravenous contrast. Multiplanar, multiecho pulse sequences of the orbits and surrounding structures were obtained including fat saturation techniques, before and after intravenous contrast administration. CONTRAST:  51mL GADAVIST GADOBUTROL 1 MMOL/ML IV SOLN COMPARISON:  None. FINDINGS: MRI HEAD FINDINGS Brain: No acute infarct, acute hemorrhage or extra-axial collection. Numerous bilateral white matter lesions, many of which are oriented perpendicularly to the long axis of the lateral ventricles. Normal volume of CSF spaces. No chronic microhemorrhage. Normal midline structures. There are multiple bilateral supratentorial white matter lesions with contrast enhancement. The largest lesions are in the right temporal lobe, left frontal lobe and right frontal lobe. Vascular: Normal flow voids. Skull and upper cervical spine: Possible white matter lesion at the cervicomedullary junction. Other: None MRI  ORBITS FINDINGS Orbits: No traumatic or inflammatory finding. Globes, optic nerves, orbital fat, extraocular muscles, vascular structures, and lacrimal glands are normal. Visualized sinuses: Clear. Soft tissues: Negative. IMPRESSION: 1. Multiple active demyelinating lesions of the supratentorial white matter. Multiple sclerosis is most likely. 2. Possible demyelinating lesion of the upper cervical spine. Dedicated cervical spine imaging recommended. 3. Normal MRI of the orbits. Electronically Signed   By: Deatra Robinson M.D.   On: 07/06/2020 19:25   MR ORBITS W WO CONTRAST  Result Date: 07/06/2020 CLINICAL DATA:  Blurry vision and headache EXAM: MRI HEAD AND ORBITS WITHOUT AND WITH CONTRAST TECHNIQUE: Multiplanar, multiecho pulse sequences of the brain and surrounding structures were obtained without and with intravenous contrast. Multiplanar, multiecho pulse sequences of the orbits and surrounding structures were obtained including fat saturation techniques, before and after intravenous contrast administration. CONTRAST:  65mL GADAVIST GADOBUTROL 1 MMOL/ML IV SOLN COMPARISON:  None. FINDINGS: MRI HEAD FINDINGS Brain: No acute infarct, acute hemorrhage or extra-axial collection. Numerous bilateral white matter lesions, many of which are oriented perpendicularly to the long axis of the lateral ventricles. Normal volume of CSF spaces. No chronic microhemorrhage. Normal midline structures. There are multiple bilateral supratentorial white matter lesions with contrast enhancement. The largest lesions are in the right temporal lobe, left frontal lobe and right frontal lobe. Vascular: Normal flow voids. Skull and upper cervical spine: Possible white matter lesion at the cervicomedullary junction. Other: None MRI ORBITS FINDINGS Orbits: No traumatic or inflammatory finding. Globes, optic nerves, orbital fat, extraocular muscles, vascular structures, and lacrimal glands are normal. Visualized sinuses: Clear. Soft  tissues: Negative. IMPRESSION: 1. Multiple active demyelinating lesions of the supratentorial white matter. Multiple sclerosis is most likely. 2. Possible demyelinating lesion of the upper cervical spine. Dedicated cervical spine imaging recommended. 3. Normal MRI of the orbits. Electronically Signed   By: Deatra Robinson M.D.   On: 07/06/2020 19:25    EKG: Independently reviewed. No EKG  Assessment/Plan Active Problems:   Diplopia   Demyelinating changes in brain [facility])  (please populate well all problems here in Problem List. (For example, if patient is on BP meds at home and you resume or decide to hold them, it is a problem that needs to be her. Same for CAD, COPD, HLD and so on)   1. Demyelinating lesions brain by MRI along with altervision and CN finding with incomplete abduction left eye. Probable diagnosis is MS with acute symptoms Plan Med-surg admit  Solumedrol 1000 mg IV daily x 3-5 days  Neuro  consult: may need LP to make better diagnosis  2. Pregnancy - positive pregnancy test. Plan will need to see OB-gyn for prenatal care.   DVT prophylaxis: lovenox Code Status: full code  Family Communication: spoke with Renae Gloss - SO.  [phone] Disposition Plan: home when medically stable Consults called: neurology - Dr. Thomasena Edis and associates (with names) Admission status: inpatient    Illene Regulus MD [facility] Pager [phone]  If 7PM-7AM, please contact night-coverage [email or web address] Password [email or web address]  07/06/2020, 11:18 PM

## 2020-07-06 NOTE — ED Triage Notes (Signed)
Patient c/o blurred vision, double vision, and headache x 3 days. patiaent went to her eye doctor and her prescription was changed , but no relief from symptoms. Patient states she was told to come to the ED for a head CT.

## 2020-07-06 NOTE — ED Provider Notes (Signed)
[facility]-EMERGENCY DEPT Provider Note   CSN: [id number] Arrival date & time: 07/06/20  1601     History Chief Complaint  Patient presents with  . Blurred Vision  . Diplopia  . Headache    Natalie Zimmerman is a 29 y.o. female.  HPI    29 yo female no significant pmh, presents today with 5 days of diplopia.  Patient states she noted some blurring of vision when looking to the left last Thursday then noted double vision in all visual fields worse with distance.  This has been constant.  She has had some headache waxing and waning since the symptoms began.  She describes it as being to the left side of her forehead.  She took ibuprofen this afternoon with some mild relief.  Pain is moderate.  She does not typically have headaches.  She denies fever, chills, neck pain, nausea, vomiting, diarrhea.  She works in a [employer].  She was seen by her eye doctor yesterday and they thought it might be secondary to her prescription.  Someone in the office but her eyes were not tracking normally.  She was advised to come to the ED for further evaluation. History reviewed. No pertinent past medical history.  Patient Active Problem List   Diagnosis Date Noted  . Preterm labor 11/27/2018    Past Surgical History:  Procedure Laterality Date  . CESAREAN SECTION N/A 11/27/2018   Procedure: CESAREAN SECTION;  Surgeon: Myna Hidalgo, DO;  Location: [facility] LD ORS;  Service: Obstetrics;  Laterality: N/A;     OB History    Gravida  1   Para  1   Term      Preterm  1   AB      Living  1     SAB      TAB      Ectopic      Multiple  0   Live Births  1           Family History  Problem Relation Age of Onset  . Cancer Maternal Grandmother   . Heart disease Maternal Grandfather   . Cancer Paternal Grandfather   . Cancer Maternal Aunt   . Cancer Paternal Aunt     Social History   Tobacco Use  . Smoking status: Never Smoker  . Smokeless tobacco:  Never Used  Vaping Use  . Vaping Use: Never used  Substance Use Topics  . Alcohol use: No    Alcohol/week: 0.0 standard drinks  . Drug use: No    Home Medications Prior to Admission medications   Medication Sig Start Date End Date Taking? Authorizing Provider  ibuprofen (ADVIL,MOTRIN) 600 MG tablet Take 1 tablet (600 mg total) by mouth every 6 (six) hours as needed for moderate pain. 11/30/18   Janeece Riggers, CNM  prenatal vitamin w/FE, FA (PRENATAL 1 + 1) 27-1 MG TABS tablet Take 1 tablet by mouth daily at 12 noon. 11/30/18   Janeece Riggers, CNM    Allergies    Patient has no known allergies.  Review of Systems   Review of Systems  HENT: Negative.   Eyes: Positive for visual disturbance. Negative for photophobia, pain, discharge, redness and itching.  Respiratory: Negative.   Cardiovascular: Negative.   Gastrointestinal: Negative.   Endocrine: Negative.   Genitourinary: Negative.   Musculoskeletal: Negative.   Skin: Negative.   Allergic/Immunologic: Negative.   Neurological: Positive for headaches. Negative for dizziness, tremors, seizures, syncope, facial asymmetry, speech  difficulty, weakness, light-headedness and numbness.  Hematological: Negative.   Psychiatric/Behavioral: Negative.   All other systems reviewed and are negative.   Physical Exam Updated Vital Signs BP (!) 139/100 (BP Location: Left Arm)   Pulse (!) 107   Temp 98.2 F (36.8 C) (Oral)   Resp 16   Ht 1.575 m (5\' 2" )   Wt 68 kg   SpO2 100%   BMI 27.44 kg/m   Physical Exam Vitals and nursing note reviewed.  Constitutional:      General: She is not in acute distress.    Appearance: She is well-developed and normal weight.  HENT:     Head: Normocephalic and atraumatic.     Mouth/Throat:     Mouth: Mucous membranes are moist.  Eyes:     General: No visual field deficit.    Extraocular Movements:     Right eye: No nystagmus.     Left eye: No nystagmus.     Pupils: Pupils are equal, round, and  reactive to light.     Comments: Left eye with decreased abduction  Cardiovascular:     Rate and Rhythm: Normal rate and regular rhythm.     Heart sounds: Normal heart sounds.  Pulmonary:     Effort: Pulmonary effort is normal.     Breath sounds: Normal breath sounds.  Abdominal:     Palpations: Abdomen is soft.  Musculoskeletal:        General: Normal range of motion.     Cervical back: Normal range of motion and neck supple.  Skin:    General: Skin is warm and dry.  Neurological:     Mental Status: She is alert.     Cranial Nerves: No cranial nerve deficit, dysarthria or facial asymmetry.     Sensory: No sensory deficit.     Motor: No weakness.     Coordination: Romberg sign negative. Coordination normal.     Gait: Gait normal.     Deep Tendon Reflexes: Reflexes normal.  Psychiatric:        Mood and Affect: Mood normal.        Behavior: Behavior normal.     ED Results / Procedures / Treatments   Labs (all labs ordered are listed, but only abnormal results are displayed) Labs Reviewed  CBC  COMPREHENSIVE METABOLIC PANEL  URINALYSIS, ROUTINE W REFLEX MICROSCOPIC  I-STAT BETA HCG BLOOD, ED ([facility], [facility], [facility] ONLY)    EKG None  Radiology MR Brain W and Wo Contrast  Result Date: 07/06/2020 CLINICAL DATA:  Blurry vision and headache EXAM: MRI HEAD AND ORBITS WITHOUT AND WITH CONTRAST TECHNIQUE: Multiplanar, multiecho pulse sequences of the brain and surrounding structures were obtained without and with intravenous contrast. Multiplanar, multiecho pulse sequences of the orbits and surrounding structures were obtained including fat saturation techniques, before and after intravenous contrast administration. CONTRAST:  68mL GADAVIST GADOBUTROL 1 MMOL/ML IV SOLN COMPARISON:  None. FINDINGS: MRI HEAD FINDINGS Brain: No acute infarct, acute hemorrhage or extra-axial collection. Numerous bilateral white matter lesions, many of which are oriented perpendicularly to the long axis of the  lateral ventricles. Normal volume of CSF spaces. No chronic microhemorrhage. Normal midline structures. There are multiple bilateral supratentorial white matter lesions with contrast enhancement. The largest lesions are in the right temporal lobe, left frontal lobe and right frontal lobe. Vascular: Normal flow voids. Skull and upper cervical spine: Possible white matter lesion at the cervicomedullary junction. Other: None MRI ORBITS FINDINGS Orbits: No traumatic or inflammatory finding. Globes,  optic nerves, orbital fat, extraocular muscles, vascular structures, and lacrimal glands are normal. Visualized sinuses: Clear. Soft tissues: Negative. IMPRESSION: 1. Multiple active demyelinating lesions of the supratentorial white matter. Multiple sclerosis is most likely. 2. Possible demyelinating lesion of the upper cervical spine. Dedicated cervical spine imaging recommended. 3. Normal MRI of the orbits. Electronically Signed   By: Deatra Robinson M.D.   On: 07/06/2020 19:25   MR ORBITS W WO CONTRAST  Result Date: 07/06/2020 CLINICAL DATA:  Blurry vision and headache EXAM: MRI HEAD AND ORBITS WITHOUT AND WITH CONTRAST TECHNIQUE: Multiplanar, multiecho pulse sequences of the brain and surrounding structures were obtained without and with intravenous contrast. Multiplanar, multiecho pulse sequences of the orbits and surrounding structures were obtained including fat saturation techniques, before and after intravenous contrast administration. CONTRAST:  103mL GADAVIST GADOBUTROL 1 MMOL/ML IV SOLN COMPARISON:  None. FINDINGS: MRI HEAD FINDINGS Brain: No acute infarct, acute hemorrhage or extra-axial collection. Numerous bilateral white matter lesions, many of which are oriented perpendicularly to the long axis of the lateral ventricles. Normal volume of CSF spaces. No chronic microhemorrhage. Normal midline structures. There are multiple bilateral supratentorial white matter lesions with contrast enhancement. The largest  lesions are in the right temporal lobe, left frontal lobe and right frontal lobe. Vascular: Normal flow voids. Skull and upper cervical spine: Possible white matter lesion at the cervicomedullary junction. Other: None MRI ORBITS FINDINGS Orbits: No traumatic or inflammatory finding. Globes, optic nerves, orbital fat, extraocular muscles, vascular structures, and lacrimal glands are normal. Visualized sinuses: Clear. Soft tissues: Negative. IMPRESSION: 1. Multiple active demyelinating lesions of the supratentorial white matter. Multiple sclerosis is most likely. 2. Possible demyelinating lesion of the upper cervical spine. Dedicated cervical spine imaging recommended. 3. Normal MRI of the orbits. Electronically Signed   By: Deatra Robinson M.D.   On: 07/06/2020 19:25    Procedures Procedures (including critical care time)  Medications Ordered in ED Medications - No data to display  ED Course  I have reviewed the triage vital signs and the nursing notes.  Pertinent labs & imaging results that were available during my care of the patient were reviewed by me and considered in my medical decision making (see chart for details). Discussed with Dr. Luan Pulling and advises mri brain and orbits and send acetylcholine abs which will not come back today. REviewed mri, discussed with Dr. Thomasena Edis and advises high dose steroids Discussed all results with patient. Initial urinalysis is significant for greater than 50 white blood cells but also has multiple squamous epithelial cells.  Patient is asymptomatic.  Plan repeat urine Patient currently test is positive.  She does not know when her last period was that she has polycystic ovaries and has irregular menses.  Discussed with Dr. Debby Bud, on-call for hospitalist and he will see for admission.   MDM Rules/Calculators/A&P                           Final Clinical Impression(s) / ED Diagnoses Final diagnoses:  Diplopia  Demyelinating lesion [facility])  Pregnancy,  unspecified gestational age    Rx / DC Orders ED Discharge Orders    None       Margarita Grizzle, MD 07/06/20 2201

## 2020-07-07 ENCOUNTER — Encounter (HOSPITAL_COMMUNITY): Payer: Self-pay | Admitting: Radiology

## 2020-07-07 ENCOUNTER — Inpatient Hospital Stay (HOSPITAL_COMMUNITY): Payer: BC Managed Care – PPO

## 2020-07-07 DIAGNOSIS — H532 Diplopia: Secondary | ICD-10-CM

## 2020-07-07 DIAGNOSIS — G379 Demyelinating disease of central nervous system, unspecified: Secondary | ICD-10-CM

## 2020-07-07 DIAGNOSIS — Z3490 Encounter for supervision of normal pregnancy, unspecified, unspecified trimester: Secondary | ICD-10-CM

## 2020-07-07 DIAGNOSIS — R739 Hyperglycemia, unspecified: Secondary | ICD-10-CM

## 2020-07-07 DIAGNOSIS — N390 Urinary tract infection, site not specified: Secondary | ICD-10-CM | POA: Diagnosis present

## 2020-07-07 LAB — BASIC METABOLIC PANEL
Anion gap: 14 (ref 5–15)
BUN: 19 mg/dL (ref 6–20)
CO2: 16 mmol/L — ABNORMAL LOW (ref 22–32)
Calcium: 9.7 mg/dL (ref 8.9–10.3)
Chloride: 109 mmol/L (ref 98–111)
Creatinine, Ser: 0.78 mg/dL (ref 0.44–1.00)
GFR, Estimated: >60 mL/min (ref >60)
Glucose, Bld: 220 mg/dL — ABNORMAL HIGH (ref 70–99)
Potassium: 4.2 mmol/L (ref 3.5–5.1)
Sodium: 139 mmol/L (ref 135–145)

## 2020-07-07 LAB — CBC WITH DIFFERENTIAL/PLATELET
Abs Immature Granulocytes: 0.03 10*3/uL (ref 0.00–0.07)
Basophils Absolute: 0 10*3/uL (ref 0.0–0.1)
Basophils Relative: 0 %
Eosinophils Absolute: 0 10*3/uL (ref 0.0–0.5)
Eosinophils Relative: 0 %
HCT: 40.1 % (ref 36.0–46.0)
Hemoglobin: 13.5 g/dL (ref 12.0–15.0)
Immature Granulocytes: 0 %
Lymphocytes Relative: 8 %
Lymphs Abs: 0.6 10*3/uL — ABNORMAL LOW (ref 0.7–4.0)
MCH: 30.4 pg (ref 26.0–34.0)
MCHC: 33.7 g/dL (ref 30.0–36.0)
MCV: 90.3 fL (ref 80.0–100.0)
Monocytes Absolute: 0 10*3/uL — ABNORMAL LOW (ref 0.1–1.0)
Monocytes Relative: 0 %
Neutro Abs: 6.7 10*3/uL (ref 1.7–7.7)
Neutrophils Relative %: 92 %
Platelets: 297 10*3/uL (ref 150–400)
RBC: 4.44 MIL/uL (ref 3.87–5.11)
RDW: 12.9 % (ref 11.5–15.5)
WBC: 7.3 10*3/uL (ref 4.0–10.5)
nRBC: 0 % (ref 0.0–0.2)

## 2020-07-07 LAB — HIV ANTIBODY (ROUTINE TESTING W REFLEX): HIV Screen 4th Generation wRfx: NONREACTIVE

## 2020-07-07 LAB — GLUCOSE, CAPILLARY: Glucose-Capillary: 142 mg/dL — ABNORMAL HIGH (ref 70–99)

## 2020-07-07 MED ORDER — SODIUM CHLORIDE 0.9 % IV SOLN
1.0000 g | INTRAVENOUS | Status: DC
  Administered 2020-07-07 – 2020-07-08 (×2): 1 g via INTRAVENOUS
  Filled 2020-07-07: qty 10
  Filled 2020-07-07 (×2): qty 1

## 2020-07-07 MED ORDER — PANTOPRAZOLE SODIUM 40 MG IV SOLR
40.0000 mg | Freq: Every day | INTRAVENOUS | Status: DC
  Administered 2020-07-07: 40 mg via INTRAVENOUS
  Filled 2020-07-07: qty 40

## 2020-07-07 MED ORDER — SODIUM CHLORIDE 0.9 % IV BOLUS
500.0000 mL | Freq: Once | INTRAVENOUS | Status: AC
  Administered 2020-07-07: 13:00:00 500 mL via INTRAVENOUS

## 2020-07-07 MED ORDER — INSULIN ASPART 100 UNIT/ML ~~LOC~~ SOLN
0.0000 [IU] | Freq: Three times a day (TID) | SUBCUTANEOUS | Status: DC
  Administered 2020-07-07: 18:00:00 2 [IU] via SUBCUTANEOUS
  Administered 2020-07-08: 13:00:00 3 [IU] via SUBCUTANEOUS
  Administered 2020-07-08 – 2020-07-09 (×3): 2 [IU] via SUBCUTANEOUS
  Administered 2020-07-10: 12:00:00 3 [IU] via SUBCUTANEOUS
  Administered 2020-07-10: 08:00:00 2 [IU] via SUBCUTANEOUS

## 2020-07-07 MED ORDER — SODIUM CHLORIDE 0.9 % IV SOLN: INTRAVENOUS | Status: DC

## 2020-07-07 NOTE — TOC Initial Note (Signed)
Transition of Care [facility]) - Initial/Assessment Note    Patient Details  Name: Natalie Zimmerman MRN: [id number] Date of Birth: [birth year]  Transition of Care [facility]) CM/SW Contact:    Golda Acre, RN Phone Number: 07/07/2020, 8:48 AM  Clinical Narrative:                 29 y.o. female with medical history significant of polycystic ovary disease presents today with 5 days of diplopia. Patient states she noted some blurring of vision when looking to the left last Thursday then noted double vision in all visual fields worse with distance. This has been constant. She has had some headache waxing and waning since the symptoms began. She describes it as being to the left side of her forehead. She took ibuprofen this afternoon with some mild relief. Pain is moderate. She does not typically have headaches. She denies fever, chills, neck pain, nausea, vomiting, diarrhea. She works in a Retail buyer. She was seen by her optomatrist yesterday and they thought it might be secondary to her prescription. At her pediatricians office, a friend and co-worker had office RN see her who noticed on exam that her eyes were not tracking normally. She was advised to come to the ED for further evaluation.  ED Course: T 98,2  125/76  HR 102  RR 17. On ED-P exam she had decrease abduction left eye. Cmet -nl, CBC - nl, U/A with many squamous cells and bacturia ( not clean catch). BHCG positive. COVID-19 NEGATIVE. MRI with demylenating lesions, also of the C-Spine. Dr. Orpah Clinton, hospital neurologist, was consulted, who recommended admission and high dose IV steroids. [facility] called to admit patient.  [id number]-pregnancy test is Positive Iv solu medrol Following for toc needs lives alone has family in the area, Following for progression.  Expected Discharge Plan: Home/Self Care Barriers to Discharge: Barriers Unresolved (comment)   Patient Goals and CMS Choice Patient states their goals for this  hospitalization and ongoing recovery are:: to go home CMS Medicare.gov Compare Post Acute Care list provided to:: Patient    Expected Discharge Plan and Services Expected Discharge Plan: Home/Self Care   Discharge Planning Services: CM Consult   Living arrangements for the past 2 months: Single Family Home                                      Prior Living Arrangements/Services Living arrangements for the past 2 months: Single Family Home Lives with:: Self Patient language and need for interpreter reviewed:: Yes Do you feel safe going back to the place where you live?: Yes      Need for Family Participation in Patient Care: Yes (Comment) Care giver support system in place?: Yes (comment)   Criminal Activity/Legal Involvement Pertinent to Current Situation/Hospitalization: No - Comment as needed  Activities of Daily Living Home Assistive Devices/Equipment: None ADL Screening (condition at time of admission) Patient's cognitive ability adequate to safely complete daily activities?: Yes Is the patient deaf or have difficulty hearing?: No Does the patient have difficulty seeing, even when wearing glasses/contacts?: Yes Does the patient have difficulty concentrating, remembering, or making decisions?: No Patient able to express need for assistance with ADLs?: Yes Does the patient have difficulty dressing or bathing?: No Independently performs ADLs?: Yes (appropriate for developmental age) Does the patient have difficulty walking or climbing stairs?: No Weakness of Legs: None Weakness of Arms/Hands: None  Permission Sought/Granted  Emotional Assessment Appearance:: Appears stated age Attitude/Demeanor/Rapport: Engaged Affect (typically observed): Calm Orientation: : Oriented to Place, Oriented to Self, Oriented to  Time, Oriented to Situation Alcohol / Substance Use: Not Applicable Psych Involvement: No (comment)  Admission diagnosis:  Diplopia  [H53.2] Demyelinating changes in brain [facility]) [G37.9] Demyelinating lesion (HCC) [G37.9] Pregnancy, unspecified gestational age [Z66.90] Patient Active Problem List   Diagnosis Date Noted   Diplopia 07/06/2020   Demyelinating changes in brain [facility]) 07/06/2020   Preterm labor 11/27/2018   PCP:  Patient, No Pcp Per Pharmacy:   [id number] [place], [place] - [address] AT [facility] OF [address] & [facility] [address] [place] [address] Phone: [phone] Fax: [phone]     Social Determinants of Health (SDOH) Interventions    Readmission Risk Interventions No flowsheet data found.

## 2020-07-07 NOTE — Progress Notes (Signed)
PROGRESS NOTE    Natalie Zimmerman  WNI:[id number] DOB: May 21, 1991 DOA: 07/06/2020 PCP: Patient, No Pcp Per    Chief Complaint  Patient presents with   Blurred Vision   Diplopia   Headache    Brief Narrative:  Patient 29 year old female history of polycystic ovarian disease presented to the ED with 5 days of diplopia, headaches which have been waxing and waning.  Patient works in the pediatrician's office was assessed by a coworker who noticed patient's eyes were not tracking normally and recommended patient present to the ED.  Patient seen in the ED beta hCG was positive, urinalysis concerning for UTI, MRI of the head done showed a demyelinating lesions concerning for multiple sclerosis.  Neurology consulted who recommended inpatient admission for high-dose IV steroids.  Neurology also consulted formally on 07/07/2020.   Assessment & Plan:   Principal Problem:   Demyelinating changes in brain [facility]) Active Problems:   Diplopia   Pregnancy   Acute lower UTI   Hyperglycemia  1 diplopia/demyelinating lesions seen on MRI/concern for probable multiple sclerosis Patient presented with diplopia, also noted to have some slight decreased abduction of the left eye.  MRI brain done with demyelinating lesions concerning for multiple sclerosis.  Neuro hospitalist consulted who had recommended high-dose IV steroids Solu-Medrol 1 g daily x5 days.  MRI of the C and T-spine pending.  Neuro hospitalist consulted.  Will need outpatient follow-up with neurology.  Continue supportive care.  2.  Pregnancy Urine pregnancy noted to be positive.  Patient started on prenatal vitamins.  Outpatient follow-up with OB/GYN.  3.  Abnormal urinalysis/UTI Urinalysis done concerning for UTI.  Urine was cloudy, large leukocytes, nitrite negative, greater than 50 WBCs, many bacteria.  Check urine cultures.  As patient is pregnant we will treat and place empirically on IV Rocephin pending culture results.  4.   Hyperglycemia Likely steroid-induced.  Patient with no prior history of diabetes.  Check a hemoglobin A1c.  Place on sliding scale insulin while on steroids.  Follow.   DVT prophylaxis: Lovenox Code Status: Full Family Communication: Updated patient and husband at bedside. Disposition:   Status is: Inpatient    Dispo: The patient is from: Home              Anticipated d/c is to: Home              Anticipated d/c date is: 07/11/2020              Patient currently currently on IV Solu-Medrol, not stable for discharge.       Consultants:   Neurology pending  Procedures:   MRI brain 07/06/2020  MRI orbits 07/06/2020  MRI of the T and C-spine pending 07/07/2020    Antimicrobials:   IV Rocephin 07/07/2020   Subjective: Sitting up in bed.  Husband at bedside.  Patient denies any chest pain.  States still with some diplopia however improving since admission.  Patient with some complaints of some shortness of breath.  No dysuria.  Noted to be ambulating in hallways without any significant difficulty.  Objective: Vitals:   07/06/20 2330 07/07/20 0049 07/07/20 0537 07/07/20 1333  BP:  118/78 123/75 119/68  Pulse:  83 (!) 105 84  Resp:  17 18 18   Temp: 98 F (36.7 C) 98.6 F (37 C) 98.2 F (36.8 C) 98.9 F (37.2 C)  TempSrc: Oral Oral  Oral  SpO2:  97% 98% 97%  Weight:      Height:  Intake/Output Summary (Last 24 hours) at 07/07/2020 1527 Last data filed at 07/07/2020 1404 Gross per 24 hour  Intake 1082.77 ml  Output 700 ml  Net 382.77 ml   Filed Weights   07/06/20 1626  Weight: 68 kg    Examination:  General exam: Appears calm and comfortable  Respiratory system: Clear to auscultation. Respiratory effort normal. Cardiovascular system: S1 & S2 heard, RRR. No JVD, murmurs, rubs, gallops or clicks. No pedal edema. Gastrointestinal system: Abdomen is nondistended, soft and nontender. No organomegaly or masses felt. Normal bowel sounds  heard. Central nervous system: Alert and oriented.  Patient with a slight left decreased abduction of the left eye otherwise the rest of the cranial nerves II through XII grossly intact.  Extremities: Symmetric 5 x 5 power. Skin: No rashes, lesions or ulcers Psychiatry: Judgement and insight appear normal. Mood & affect appropriate.     Data Reviewed: I have personally reviewed following labs and imaging studies  CBC: Recent Labs  Lab 07/06/20 1731 07/07/20 0945  WBC 8.7 7.3  NEUTROABS  --  6.7  HGB 13.0 13.5  HCT 39.5 40.1  MCV 91.4 90.3  PLT 290 297    Basic Metabolic Panel: Recent Labs  Lab 07/06/20 1731 07/07/20 0945  NA 137 139  K 3.6 4.2  CL 107 109  CO2 22 16*  GLUCOSE 98 220*  BUN 15 19  CREATININE 0.76 0.78  CALCIUM 9.1 9.7    GFR: Estimated Creatinine Clearance: 93 mL/min (by C-G formula based on SCr of 0.78 mg/dL).  Liver Function Tests: Recent Labs  Lab 07/06/20 1731  AST 17  ALT 15  ALKPHOS 48  BILITOT 0.8  PROT 7.8  ALBUMIN 4.5    CBG: No results for input(s): GLUCAP in the last 168 hours.   Recent Results (from the past 240 hour(s))  Respiratory Panel by RT PCR (Flu A&B, Covid) - Nasopharyngeal Swab     Status: None   Collection Time: 07/06/20  5:32 PM   Specimen: Nasopharyngeal Swab  Result Value Ref Range Status   SARS Coronavirus 2 by RT PCR NEGATIVE NEGATIVE Final    Comment: (NOTE) SARS-CoV-2 target nucleic acids are NOT DETECTED.  The SARS-CoV-2 RNA is generally detectable in upper respiratoy specimens during the acute phase of infection. The lowest concentration of SARS-CoV-2 viral copies this assay can detect is 131 copies/mL. A negative result does not preclude SARS-Cov-2 infection and should not be used as the sole basis for treatment or other patient management decisions. A negative result may occur with  improper specimen collection/handling, submission of specimen other than nasopharyngeal swab, presence of viral  mutation(s) within the areas targeted by this assay, and inadequate number of viral copies (<131 copies/mL). A negative result must be combined with clinical observations, patient history, and epidemiological information. The expected result is Negative.  Fact Sheet for Patients:  https://www.moore.com/  Fact Sheet for Healthcare Providers:  https://www.young.biz/  This test is no t yet approved or cleared by the [place] FDA and  has been authorized for detection and/or diagnosis of SARS-CoV-2 by FDA under an Emergency Use Authorization (EUA). This EUA will remain  in effect (meaning this test can be used) for the duration of the COVID-19 declaration under Section 564(b)(1) of the Act, 21 U.S.C. section 360bbb-3(b)(1), unless the authorization is terminated or revoked sooner.     Influenza A by PCR NEGATIVE NEGATIVE Final   Influenza B by PCR NEGATIVE NEGATIVE Final    Comment: (NOTE) The  Xpert Xpress SARS-CoV-2/FLU/RSV assay is intended as an aid in  the diagnosis of influenza from Nasopharyngeal swab specimens and  should not be used as a sole basis for treatment. Nasal washings and  aspirates are unacceptable for Xpert Xpress SARS-CoV-2/FLU/RSV  testing.  Fact Sheet for Patients: https://www.moore.com/  Fact Sheet for Healthcare Providers: https://www.young.biz/  This test is not yet approved or cleared by the [place] FDA and  has been authorized for detection and/or diagnosis of SARS-CoV-2 by  FDA under an Emergency Use Authorization (EUA). This EUA will remain  in effect (meaning this test can be used) for the duration of the  Covid-19 declaration under Section 564(b)(1) of the Act, 21  U.S.C. section 360bbb-3(b)(1), unless the authorization is  terminated or revoked. Performed at [facility], [address], [place], [place] [address]          Radiology  Studies: MR Brain W and Wo Contrast  Result Date: 07/06/2020 CLINICAL DATA:  Blurry vision and headache EXAM: MRI HEAD AND ORBITS WITHOUT AND WITH CONTRAST TECHNIQUE: Multiplanar, multiecho pulse sequences of the brain and surrounding structures were obtained without and with intravenous contrast. Multiplanar, multiecho pulse sequences of the orbits and surrounding structures were obtained including fat saturation techniques, before and after intravenous contrast administration. CONTRAST:  7mL GADAVIST GADOBUTROL 1 MMOL/ML IV SOLN COMPARISON:  None. FINDINGS: MRI HEAD FINDINGS Brain: No acute infarct, acute hemorrhage or extra-axial collection. Numerous bilateral white matter lesions, many of which are oriented perpendicularly to the long axis of the lateral ventricles. Normal volume of CSF spaces. No chronic microhemorrhage. Normal midline structures. There are multiple bilateral supratentorial white matter lesions with contrast enhancement. The largest lesions are in the right temporal lobe, left frontal lobe and right frontal lobe. Vascular: Normal flow voids. Skull and upper cervical spine: Possible white matter lesion at the cervicomedullary junction. Other: None MRI ORBITS FINDINGS Orbits: No traumatic or inflammatory finding. Globes, optic nerves, orbital fat, extraocular muscles, vascular structures, and lacrimal glands are normal. Visualized sinuses: Clear. Soft tissues: Negative. IMPRESSION: 1. Multiple active demyelinating lesions of the supratentorial white matter. Multiple sclerosis is most likely. 2. Possible demyelinating lesion of the upper cervical spine. Dedicated cervical spine imaging recommended. 3. Normal MRI of the orbits. Electronically Signed   By: Deatra Robinson M.D.   On: 07/06/2020 19:25   MR CERVICAL SPINE WO CONTRAST  Result Date: 07/07/2020 CLINICAL DATA:  Multiple sclerosis. EXAM: MRI CERVICAL AND THORACIC SPINE WITHOUT CONTRAST TECHNIQUE: Multiplanar and multiecho pulse  sequences of the cervical spine, to include the craniocervical junction and cervicothoracic junction, and the thoracic spine, were obtained without intravenous contrast. COMPARISON:  None. FINDINGS: MRI CERVICAL SPINE FINDINGS Alignment: Straightening of the cervical curvature. Vertebrae: No fracture, evidence of discitis, or bone lesion. Cord: Foci of subtle increased T2 signal within the cervical cord at the C2-3 level posteriorly and C3-4 level on the left. Posterior Fossa, vertebral arteries, paraspinal tissues: Negative. Disc levels: No significant disc protrusion, spinal canal or neural foraminal stenosis at any cervical level. MRI THORACIC SPINE FINDINGS Alignment:  Physiologic. Vertebrae: No fracture, evidence of discitis, or bone lesion. Cord: T2 hyperintense lesions are seen at the T1-2 level anteriorly, at T3 on the left, T3-4 on the right, T6 on the right and anteriorly at its T11-12. Paraspinal and other soft tissues: Negative. Disc levels: No significant disc herniation, spinal canal or neural foraminal stenosis at any thoracic level. IMPRESSION: 1. Multiple foci of increased T2 signal within the cervical  and thoracic cord consistent with the clinical diagnosis of multiple sclerosis. 2. No significant spinal canal or neural foraminal stenosis at any cervical or thoracic level. Electronically Signed   By: Baldemar Lenis M.D.   On: 07/07/2020 13:55   MR THORACIC SPINE WO CONTRAST  Result Date: 07/07/2020 CLINICAL DATA:  Multiple sclerosis. EXAM: MRI CERVICAL AND THORACIC SPINE WITHOUT CONTRAST TECHNIQUE: Multiplanar and multiecho pulse sequences of the cervical spine, to include the craniocervical junction and cervicothoracic junction, and the thoracic spine, were obtained without intravenous contrast. COMPARISON:  None. FINDINGS: MRI CERVICAL SPINE FINDINGS Alignment: Straightening of the cervical curvature. Vertebrae: No fracture, evidence of discitis, or bone lesion. Cord: Foci of  subtle increased T2 signal within the cervical cord at the C2-3 level posteriorly and C3-4 level on the left. Posterior Fossa, vertebral arteries, paraspinal tissues: Negative. Disc levels: No significant disc protrusion, spinal canal or neural foraminal stenosis at any cervical level. MRI THORACIC SPINE FINDINGS Alignment:  Physiologic. Vertebrae: No fracture, evidence of discitis, or bone lesion. Cord: T2 hyperintense lesions are seen at the T1-2 level anteriorly, at T3 on the left, T3-4 on the right, T6 on the right and anteriorly at its T11-12. Paraspinal and other soft tissues: Negative. Disc levels: No significant disc herniation, spinal canal or neural foraminal stenosis at any thoracic level. IMPRESSION: 1. Multiple foci of increased T2 signal within the cervical and thoracic cord consistent with the clinical diagnosis of multiple sclerosis. 2. No significant spinal canal or neural foraminal stenosis at any cervical or thoracic level. Electronically Signed   By: Baldemar Lenis M.D.   On: 07/07/2020 13:55   MR ORBITS W WO CONTRAST  Result Date: 07/06/2020 CLINICAL DATA:  Blurry vision and headache EXAM: MRI HEAD AND ORBITS WITHOUT AND WITH CONTRAST TECHNIQUE: Multiplanar, multiecho pulse sequences of the brain and surrounding structures were obtained without and with intravenous contrast. Multiplanar, multiecho pulse sequences of the orbits and surrounding structures were obtained including fat saturation techniques, before and after intravenous contrast administration. CONTRAST:  95mL GADAVIST GADOBUTROL 1 MMOL/ML IV SOLN COMPARISON:  None. FINDINGS: MRI HEAD FINDINGS Brain: No acute infarct, acute hemorrhage or extra-axial collection. Numerous bilateral white matter lesions, many of which are oriented perpendicularly to the long axis of the lateral ventricles. Normal volume of CSF spaces. No chronic microhemorrhage. Normal midline structures. There are multiple bilateral supratentorial  white matter lesions with contrast enhancement. The largest lesions are in the right temporal lobe, left frontal lobe and right frontal lobe. Vascular: Normal flow voids. Skull and upper cervical spine: Possible white matter lesion at the cervicomedullary junction. Other: None MRI ORBITS FINDINGS Orbits: No traumatic or inflammatory finding. Globes, optic nerves, orbital fat, extraocular muscles, vascular structures, and lacrimal glands are normal. Visualized sinuses: Clear. Soft tissues: Negative. IMPRESSION: 1. Multiple active demyelinating lesions of the supratentorial white matter. Multiple sclerosis is most likely. 2. Possible demyelinating lesion of the upper cervical spine. Dedicated cervical spine imaging recommended. 3. Normal MRI of the orbits. Electronically Signed   By: Deatra Robinson M.D.   On: 07/06/2020 19:25        Scheduled Meds:  enoxaparin (LOVENOX) injection  40 mg Subcutaneous Q24H   insulin aspart  0-15 Units Subcutaneous TID WC   pantoprazole (PROTONIX) IV  40 mg Intravenous Daily   prenatal vitamin w/FE, FA  1 tablet Oral Q1200   sodium chloride flush  3 mL Intravenous Q12H   Continuous Infusions:  sodium chloride  sodium chloride 100 mL/hr at 07/07/20 1306   cefTRIAXone (ROCEPHIN)  IV     methylPREDNISolone (SOLU-MEDROL) injection       LOS: 1 day    Time spent: 35 minutes    Ramiro Harvest, MD [facility]   To contact the attending provider between 7A-7P or the covering provider during after hours 7P-7A, please log into the web site [email or web address] and access using universal [place] password for that web site. If you do not have the password, please call the hospital operator.  07/07/2020, 3:27 PM

## 2020-07-07 NOTE — Consult Note (Signed)
Neurology Consultation  Reason for Consult: New onset multiple sclerosis Referring Physician: Dr. Janee Morn  CC: Double vision and blurred vision starting on Thursday  History is obtained from: Patient  HPI: Natalie Zimmerman is a 29 y.o. female with no past medical history.  Patient states that last Thursday on 07/01/2020, she noted that when she looked left and distantly she had blurred vision.  This all started late afternoon.  The next day on Friday she then noted that she had blurry vision in the distance through all fields that she called her ophthalmologist.  They had made a appointment for her on November 16.  On Saturday she was driving and she noticed she had diplopia when she was looking in the distance.  It was at that point that she stopped driving due to this.  Sunday the symptoms did not change.  On Monday she called the ophthalmologist again and got an appointment at 10:30 in the morning because she was also having a headache.  She had an eye exam and he noted that her vision had gotten worse and thought the headache might be secondary to the visual strain.  However due to the fact that the contacts did not help with her symptoms she was given a further appointment for later that November.  However, on Tuesday she was with her coworkers and they noted that when she was looking to the left her right eye actually leg behind moving medially compared to her left eye.  She also noted that when looking to the left everything was double and blurry in addition to everything when she was looking straight was blurry.  Was at this point that she came to the hospital.  Patient obtained a MRI of her orbits and brain with and without contrast.  This revealed multiple active demyelinating lesions of the supratentorial white matter.  Multiple sclerosis was the most likely diagnosis.  There was also possible demyelinating lesions of the upper cervical spine.  MRI of the orbits were normal.   No pertinent past  medical history per patient  Family History  Problem Relation Age of Onset  . Cancer Maternal Grandmother   . Heart disease Maternal Grandfather   . Cancer Paternal Grandfather   . Cancer Maternal Aunt   . Cancer Paternal Aunt    Social History:   reports that she has never smoked. She has never used smokeless tobacco. She reports that she does not drink alcohol and does not use drugs.  Medications  Current Facility-Administered Medications:  .  0.9 %  sodium chloride infusion, 250 mL, Intravenous, PRN, Norins, Rosalyn Gess, MD .  acetaminophen (TYLENOL) tablet 650 mg, 650 mg, Oral, Q6H PRN **OR** acetaminophen (TYLENOL) suppository 650 mg, 650 mg, Rectal, Q6H PRN, Norins, Rosalyn Gess, MD .  enoxaparin (LOVENOX) injection 40 mg, 40 mg, Subcutaneous, Q24H, Norins, Rosalyn Gess, MD .  LORazepam (ATIVAN) injection 0.5 mg, 0.5 mg, Intravenous, Q6H PRN, Norins, Rosalyn Gess, MD .  methylPREDNISolone sodium succinate (SOLU-MEDROL) 1,000 mg in sodium chloride 0.9 % 50 mL IVPB, 1,000 mg, Intravenous, QHS, Norins, Rosalyn Gess, MD .  prenatal vitamin w/FE, FA (PRENATAL 1 + 1) 27-1 MG tablet 1 tablet, 1 tablet, Oral, Q1200, Norins, Rosalyn Gess, MD .  sodium chloride flush (NS) 0.9 % injection 3 mL, 3 mL, Intravenous, Q12H, Norins, Michael E, MD .  sodium chloride flush (NS) 0.9 % injection 3 mL, 3 mL, Intravenous, PRN, Norins, Rosalyn Gess, MD  ROS:    General ROS: Positive for -  headache Psychological ROS: negative for - behavioral disorder, hallucinations, memory difficulties, mood swings or suicidal ideation Ophthalmic ROS: Positive for - blurry vision, double vision,  ENT ROS: negative for - epistaxis, nasal discharge, oral lesions, sore throat, tinnitus or vertigo Allergy and Immunology ROS: negative for - hives or itchy/watery eyes Hematological and Lymphatic ROS: negative for - bleeding problems, bruising or swollen lymph nodes Endocrine ROS: negative for - galactorrhea, hair pattern changes,  polydipsia/polyuria or temperature intolerance Respiratory ROS: negative for - cough, hemoptysis, shortness of breath or wheezing Cardiovascular ROS: negative for - chest pain, dyspnea on exertion, edema or irregular heartbeat Gastrointestinal ROS: negative for - abdominal pain, diarrhea, hematemesis, nausea/vomiting or stool incontinence Genito-Urinary ROS: negative for - dysuria, hematuria, incontinence or urinary frequency/urgency Musculoskeletal ROS: negative for - joint swelling or muscular weakness Neurological ROS: as noted in HPI Dermatological ROS: negative for rash and skin lesion changes  Exam: Current vital signs: BP 123/75 (BP Location: Left Arm)   Pulse (!) 105   Temp 98.2 F (36.8 C)   Resp 18   Ht 5\' 2"  (1.575 m)   Wt 68 kg   SpO2 98%   BMI 27.44 kg/m  Vital signs in last 24 hours: Temp:  [98 F (36.7 C)-98.6 F (37 C)] 98.2 F (36.8 C) (10/27 0537) Pulse Rate:  [83-111] 105 (10/27 0537) Resp:  [16-18] 18 (10/27 0537) BP: (117-139)/(75-103) 123/75 (10/27 0537) SpO2:  [97 %-100 %] 98 % (10/27 0537) Weight:  [68 kg] 68 kg (10/26 1626)   Constitutional: Appears well-developed and well-nourished.  Eyes: No scleral injection HENT: No OP obstrucion Head: Normocephalic.  Cardiovascular: Normal rate and regular rhythm.  Respiratory: Effort normal, non-labored breathing GI: Soft.  No distension. There is no tenderness.  Skin: WDI  Neuro: Mental Status: Patient is awake, alert, oriented to person, place, month, year, and situation.  Speech shows no dysarthria or aphasia.  Naming, repeating, comprehension is fully intact. Cranial Nerves: II: Visual Fields are full.  OD and OS showed 20/20 vision, patient did state that she has blurred vision and side-by-side diplopia when looking to the left III,IV, VI: EOMI without ptosis or diploplia. Pupils equal, round and reactive to light V: Facial sensation is symmetric to temperature VII: Facial movement is symmetric.   VIII: hearing is intact to voice X: Palat elevates symmetrically XI: Shoulder shrug is symmetric. XII: tongue is midline without atrophy or fasciculations.  Motor: Tone is normal. Bulk is normal. 5/5 strength was present in all four extremities.  Sensory: Sensation is symmetric to light touch and temperature in the arms and legs. Deep Tendon Reflexes: 2+ and symmetric in the biceps and patellae.  Plantars: Toes are downgoing bilaterally.  Cerebellar: FNF and HKS are intact bilaterally  Labs I have reviewed labs in epic and the results pertinent to this consultation are:  CBC    Component Value Date/Time   WBC 8.7 07/06/2020 1731   RBC 4.32 07/06/2020 1731   HGB 13.0 07/06/2020 1731   HCT 39.5 07/06/2020 1731   PLT 290 07/06/2020 1731   MCV 91.4 07/06/2020 1731   MCH 30.1 07/06/2020 1731   MCHC 32.9 07/06/2020 1731   RDW 12.9 07/06/2020 1731    CMP     Component Value Date/Time   NA 137 07/06/2020 1731   K 3.6 07/06/2020 1731   CL 107 07/06/2020 1731   CO2 22 07/06/2020 1731   GLUCOSE 98 07/06/2020 1731   BUN 15 07/06/2020 1731   CREATININE 0.76 07/06/2020  1731   CALCIUM 9.1 07/06/2020 1731   PROT 7.8 07/06/2020 1731   ALBUMIN 4.5 07/06/2020 1731   AST 17 07/06/2020 1731   ALT 15 07/06/2020 1731   ALKPHOS 48 07/06/2020 1731   BILITOT 0.8 07/06/2020 1731   GFRNONAA >60 07/06/2020 1731   GFRAA >60 11/27/2018 1957    Imaging I have reviewed the images obtained:   MRI examination of the brain/orbits-multiple active demyelinating lesions of the supratentorial white matter.  Multiple sclerosis was the most likely diagnosis.  There was also possible demyelinating lesions of the upper cervical spine.  MRI of the orbits were normal.  Felicie Morn PA-C [facility] [phone]  M-F  (9:00 am- 5:00 PM)  07/07/2020, 9:43 AM   I have seen the patient reviewed the above note.  Assessment:  This is a 29 year old female presenting to the hospital after  6 days of blurred vision and diplopia.  With the distribution of lesions on MRI as well as the presence of enhancing and nonenhancing lesions.  Given the number of lesions, I do think she has at least moderately aggressive MS.  Impression: -Newly diagnosed multiple sclerosis  Recommendations: -3-5 days of Solu-Medrol 1000 mg with as well as prophylactic Protonix 40 mg -She will likely need to be started on disease modifying therapy as an outpatient.   Ritta Slot, MD [facility] [phone]  If 7pm- 7am, please page neurology on call as listed in AMION.

## 2020-07-08 DIAGNOSIS — G35 Multiple sclerosis: Secondary | ICD-10-CM | POA: Diagnosis present

## 2020-07-08 DIAGNOSIS — G35A Relapsing-remitting multiple sclerosis: Secondary | ICD-10-CM | POA: Diagnosis present

## 2020-07-08 LAB — CBC WITH DIFFERENTIAL/PLATELET
Abs Immature Granulocytes: 0.07 10*3/uL (ref 0.00–0.07)
Basophils Absolute: 0 10*3/uL (ref 0.0–0.1)
Basophils Relative: 0 %
Eosinophils Absolute: 0 10*3/uL (ref 0.0–0.5)
Eosinophils Relative: 0 %
HCT: 40.3 % (ref 36.0–46.0)
Hemoglobin: 13.3 g/dL (ref 12.0–15.0)
Immature Granulocytes: 1 %
Lymphocytes Relative: 8 %
Lymphs Abs: 0.9 10*3/uL (ref 0.7–4.0)
MCH: 30.3 pg (ref 26.0–34.0)
MCHC: 33 g/dL (ref 30.0–36.0)
MCV: 91.8 fL (ref 80.0–100.0)
Monocytes Absolute: 0.1 10*3/uL (ref 0.1–1.0)
Monocytes Relative: 1 %
Neutro Abs: 10.6 10*3/uL — ABNORMAL HIGH (ref 1.7–7.7)
Neutrophils Relative %: 90 %
Platelets: 303 10*3/uL (ref 150–400)
RBC: 4.39 MIL/uL (ref 3.87–5.11)
RDW: 13.2 % (ref 11.5–15.5)
WBC: 11.7 10*3/uL — ABNORMAL HIGH (ref 4.0–10.5)
nRBC: 0 % (ref 0.0–0.2)

## 2020-07-08 LAB — GLUCOSE, CAPILLARY
Glucose-Capillary: 129 mg/dL — ABNORMAL HIGH (ref 70–99)
Glucose-Capillary: 159 mg/dL — ABNORMAL HIGH (ref 70–99)
Glucose-Capillary: 102 mg/dL — ABNORMAL HIGH (ref 70–99)
Glucose-Capillary: 124 mg/dL — ABNORMAL HIGH (ref 70–99)

## 2020-07-08 LAB — URINE CULTURE: Culture: NO GROWTH

## 2020-07-08 LAB — BASIC METABOLIC PANEL
Anion gap: 9 (ref 5–15)
BUN: 17 mg/dL (ref 6–20)
CO2: 20 mmol/L — ABNORMAL LOW (ref 22–32)
Calcium: 8.6 mg/dL — ABNORMAL LOW (ref 8.9–10.3)
Chloride: 110 mmol/L (ref 98–111)
Creatinine, Ser: 0.66 mg/dL (ref 0.44–1.00)
GFR, Estimated: >60 mL/min (ref >60)
Glucose, Bld: 148 mg/dL — ABNORMAL HIGH (ref 70–99)
Potassium: 4.1 mmol/L (ref 3.5–5.1)
Sodium: 139 mmol/L (ref 135–145)

## 2020-07-08 LAB — MAGNESIUM: Magnesium: 2.2 mg/dL (ref 1.7–2.4)

## 2020-07-08 MED ORDER — FAMOTIDINE 20 MG PO TABS
20.0000 mg | ORAL_TABLET | Freq: Every day | ORAL | Status: DC
  Administered 2020-07-08 – 2020-07-10 (×3): 20 mg via ORAL
  Filled 2020-07-08 (×3): qty 1

## 2020-07-08 MED ORDER — PANTOPRAZOLE SODIUM 40 MG PO TBEC
40.0000 mg | DELAYED_RELEASE_TABLET | Freq: Every day | ORAL | Status: DC
Start: 2020-07-08 — End: 2020-07-08

## 2020-07-08 NOTE — Progress Notes (Signed)
NEUROLOGY PROGRESS NOTE  Subjective: Patient is feeling that her vision is improving.  She states that she is no longer seeing double vision when she looks distantly and or to the left.  However she states that she still has blurred vision when looking to the left.  Exam: Vitals:   07/07/20 2056 07/08/20 0608  BP: 112/69 (!) 99/54  Pulse: 73 76  Resp: 18 18  Temp: 98 F (36.7 C) 98 F (36.7 C)  SpO2: 98% 98%    Neuro:  Mental Status: Alert, oriented, thought content appropriate.  Speech fluent without evidence of aphasia.  Able to follow 3 step commands without difficulty. Cranial Nerves: II: When looking far left she still has blurred vision other than that her vision is grossly intact III,IV, VI: ptosis not present, extra-ocular motions intact bilaterally pupils equal, round, reactive to light and accommodation V,VII: smile symmetric, facial light touch sensation normal bilaterally XII: midline tongue extension Motor: Right : Upper extremity   5/5    Left:     Upper extremity   5/5  Lower extremity   5/5     Lower extremity   5/5 Tone and bulk:normal tone throughout; no atrophy noted Sensory: Pinprick and light touch intact throughout, bilaterally Deep Tendon Reflexes: 2+ and symmetric throughout Cerebellar: normal finger-to-nose, normal rapid alternating movements and normal heel-to-shin test     Medications:  Scheduled: . enoxaparin (LOVENOX) injection  40 mg Subcutaneous Q24H  . famotidine  20 mg Oral Daily  . insulin aspart  0-15 Units Subcutaneous TID WC  . prenatal vitamin w/FE, FA  1 tablet Oral Q1200  . sodium chloride flush  3 mL Intravenous Q12H   Continuous: . sodium chloride    . cefTRIAXone (ROCEPHIN)  IV 1 g (07/07/20 1529)  . methylPREDNISolone (SOLU-MEDROL) injection 1,000 mg (07/07/20 2256)    Pertinent Labs/Diagnostics:    MR Brain W and Wo Contrast  Result Date: 07/06/2020 CLINICAL DATA:  Blurry vision and headache EXAM: MRI HEAD AND ORBITS  WITHOUT AND WITH CONTRAST TECHNIQUE: Multiplanar, multiecho pulse sequences of the brain and surrounding structures were obtained without and with intravenous contrast. Multiplanar, multiecho pulse sequences of the orbits and surrounding structures were obtained including fat saturation techniques, before and after intravenous contrast administration. CONTRAST:  51mL GADAVIST GADOBUTROL 1 MMOL/ML IV SOLN COMPARISON:  None. FINDINGS: MRI HEAD FINDINGS Brain: No acute infarct, acute hemorrhage or extra-axial collection. Numerous bilateral white matter lesions, many of which are oriented perpendicularly to the long axis of the lateral ventricles. Normal volume of CSF spaces. No chronic microhemorrhage. Normal midline structures. There are multiple bilateral supratentorial white matter lesions with contrast enhancement. The largest lesions are in the right temporal lobe, left frontal lobe and right frontal lobe. Vascular: Normal flow voids. Skull and upper cervical spine: Possible white matter lesion at the cervicomedullary junction. Other: None MRI ORBITS FINDINGS Orbits: No traumatic or inflammatory finding. Globes, optic nerves, orbital fat, extraocular muscles, vascular structures, and lacrimal glands are normal. Visualized sinuses: Clear. Soft tissues: Negative. IMPRESSION: 1. Multiple active demyelinating lesions of the supratentorial white matter. Multiple sclerosis is most likely. 2. Possible demyelinating lesion of the upper cervical spine. Dedicated cervical spine imaging recommended. 3. Normal MRI of the orbits. Electronically Signed   By: Deatra Robinson M.D.   On: 07/06/2020 19:25   MR CERVICAL SPINE WO CONTRAST  Result Date: 07/07/2020 CLINICAL DATA:  Multiple sclerosis. EXAM: MRI CERVICAL AND THORACIC SPINE WITHOUT CONTRAST TECHNIQUE: Multiplanar and multiecho pulse sequences of  the cervical spine, to include the craniocervical junction and cervicothoracic junction, and the thoracic spine, were obtained  without intravenous contrast. COMPARISON:  None. FINDINGS: MRI CERVICAL SPINE FINDINGS Alignment: Straightening of the cervical curvature. Vertebrae: No fracture, evidence of discitis, or bone lesion. Cord: Foci of subtle increased T2 signal within the cervical cord at the C2-3 level posteriorly and C3-4 level on the left. Posterior Fossa, vertebral arteries, paraspinal tissues: Negative. Disc levels: No significant disc protrusion, spinal canal or neural foraminal stenosis at any cervical level. MRI THORACIC SPINE FINDINGS Alignment:  Physiologic. Vertebrae: No fracture, evidence of discitis, or bone lesion. Cord: T2 hyperintense lesions are seen at the T1-2 level anteriorly, at T3 on the left, T3-4 on the right, T6 on the right and anteriorly at its T11-12. Paraspinal and other soft tissues: Negative. Disc levels: No significant disc herniation, spinal canal or neural foraminal stenosis at any thoracic level. IMPRESSION: 1. Multiple foci of increased T2 signal within the cervical and thoracic cord consistent with the clinical diagnosis of multiple sclerosis. 2. No significant spinal canal or neural foraminal stenosis at any cervical or thoracic level. Electronically Signed   By: Baldemar Lenis M.D.   On: 07/07/2020 13:55   MR THORACIC SPINE WO CONTRAST  Result Date: 07/07/2020 CLINICAL DATA:  Multiple sclerosis. EXAM: MRI CERVICAL AND THORACIC SPINE WITHOUT CONTRAST TECHNIQUE: Multiplanar and multiecho pulse sequences of the cervical spine, to include the craniocervical junction and cervicothoracic junction, and the thoracic spine, were obtained without intravenous contrast. COMPARISON:  None. FINDINGS: MRI CERVICAL SPINE FINDINGS Alignment: Straightening of the cervical curvature. Vertebrae: No fracture, evidence of discitis, or bone lesion. Cord: Foci of subtle increased T2 signal within the cervical cord at the C2-3 level posteriorly and C3-4 level on the left. Posterior Fossa, vertebral  arteries, paraspinal tissues: Negative. Disc levels: No significant disc protrusion, spinal canal or neural foraminal stenosis at any cervical level. MRI THORACIC SPINE FINDINGS Alignment:  Physiologic. Vertebrae: No fracture, evidence of discitis, or bone lesion. Cord: T2 hyperintense lesions are seen at the T1-2 level anteriorly, at T3 on the left, T3-4 on the right, T6 on the right and anteriorly at its T11-12. Paraspinal and other soft tissues: Negative. Disc levels: No significant disc herniation, spinal canal or neural foraminal stenosis at any thoracic level. IMPRESSION: 1. Multiple foci of increased T2 signal within the cervical and thoracic cord consistent with the clinical diagnosis of multiple sclerosis. 2. No significant spinal canal or neural foraminal stenosis at any cervical or thoracic level. Electronically Signed   By: Baldemar Lenis M.D.   On: 07/07/2020 13:55   MR ORBITS W WO CONTRAST  Result Date: 07/06/2020 CLINICAL DATA:  Blurry vision and headache EXAM: MRI HEAD AND ORBITS WITHOUT AND WITH CONTRAST TECHNIQUE: Multiplanar, multiecho pulse sequences of the brain and surrounding structures were obtained without and with intravenous contrast. Multiplanar, multiecho pulse sequences of the orbits and surrounding structures were obtained including fat saturation techniques, before and after intravenous contrast administration. CONTRAST:  67mL GADAVIST GADOBUTROL 1 MMOL/ML IV SOLN COMPARISON:  None. FINDINGS: MRI HEAD FINDINGS Brain: No acute infarct, acute hemorrhage or extra-axial collection. Numerous bilateral white matter lesions, many of which are oriented perpendicularly to the long axis of the lateral ventricles. Normal volume of CSF spaces. No chronic microhemorrhage. Normal midline structures. There are multiple bilateral supratentorial white matter lesions with contrast enhancement. The largest lesions are in the right temporal lobe, left frontal lobe and right frontal  lobe. Vascular:  Normal flow voids. Skull and upper cervical spine: Possible white matter lesion at the cervicomedullary junction. Other: None MRI ORBITS FINDINGS Orbits: No traumatic or inflammatory finding. Globes, optic nerves, orbital fat, extraocular muscles, vascular structures, and lacrimal glands are normal. Visualized sinuses: Clear. Soft tissues: Negative. IMPRESSION: 1. Multiple active demyelinating lesions of the supratentorial white matter. Multiple sclerosis is most likely. 2. Possible demyelinating lesion of the upper cervical spine. Dedicated cervical spine imaging recommended. 3. Normal MRI of the orbits. Electronically Signed   By: Deatra Robinson M.D.   On: 07/06/2020 19:25      Assessment:  This is a 29 year old female presenting to the hospital after 6 days of blurred vision and diplopia.  With the distribution of lesions on MRI as well as the presence of enhancing and nonenhancing lesions.  Given the number of lesions, I do think she has at least moderately aggressive MS. on exam today patient does have improvement with the diplopia however still stating that she has blurred vision when looking to the left  Impression: -Newly diagnosed multiple sclerosis  Recommendations: -5 days of Solu-Medrol 1000 mg with as well as prophylactic Protonix 40 mg-today will be day 3 -She will likely need to be started on disease modifying therapy as an outpatient   Felicie Morn PA-C [facility] [phone]   07/08/2020, 10:47 AM

## 2020-07-08 NOTE — Progress Notes (Signed)
This patient inadvertently received an intravenous gadolinium based contrast agent (7 cc Gadavist) for the MRI of the head and orbits 07/06/2020 despite newly diagnosed 1st trimester pregnancy.  Use of gadolinium based agents is typically avoided during pregnancy unless absolutely necessary, and when used, only after appropriate clinical discussions and patient consent.  The overall risk to the patient and her unborn child is deemed to be very low.  The use of contrast for this clinical indication is standard of care in non pregnant patients.   I discussed this error by telephone with the patient today (07/08/2020 at 1215 hours), offered support and answered any questions she posed.

## 2020-07-08 NOTE — Progress Notes (Addendum)
PROGRESS NOTE    Natalie Zimmerman  ZOX:[id number] DOB: Mar 17, 1991 DOA: 07/06/2020 PCP: Patient, No Pcp Per    Chief Complaint  Patient presents with  . Blurred Vision  . Diplopia  . Headache    Brief Narrative:  Patient 29 year old female history of polycystic ovarian disease presented to the ED with 5 days of diplopia, headaches which have been waxing and waning.  Patient works in the pediatrician's office was assessed by a coworker who noticed patient's eyes were not tracking normally and recommended patient present to the ED.  Patient seen in the ED beta hCG was positive, urinalysis concerning for UTI, MRI of the head done showed a demyelinating lesions concerning for multiple sclerosis.  Neurology consulted who recommended inpatient admission for high-dose IV steroids.  Neurology also consulted formally on 07/07/2020.   Assessment & Plan:   Principal Problem:   Multiple sclerosis (HCC) Active Problems:   Demyelinating changes in brain [facility])   Diplopia   Pregnancy   Acute lower UTI   Hyperglycemia  1 diplopia/demyelinating lesions seen on MRI/secondary to newly diagnosed multiple sclerosis Patient presented with diplopia, also noted to have some slight decreased abduction of the left eye.  MRI brain done with demyelinating lesions concerning for multiple sclerosis.  MRI of the C and T-spine with multiple foci of increased T2 signal within the cervical and thoracic cord consistent with clinical diagnosis of multiple sclerosis.  No significant spinal canal or neuroforaminal stenosis at any cervical or thoracic level.  Patient seen in consultation by neuro hospitalist who recommended high-dose Solu-Medrol 1 g daily x5 days.  Patient placed on Pepcid.  Patient with clinical improvement.  Will need outpatient follow-up with neurology and may likely need to be started on disease modifying therapy as outpatient per neurology.  2.  Pregnancy Urine pregnancy noted to be positive.  Patient  started on prenatal vitamins.  Outpatient follow-up with OB/GYN.  3.  Abnormal urinalysis/UTI Urinalysis done concerning for UTI.  Urine was cloudy, large leukocytes, nitrite negative, greater than 50 WBCs, many bacteria.  Urine culture is pending.  Patient started on treatment as noted to be newly pregnant.  Continue IV Rocephin D 2/3.   4.  Hyperglycemia Likely steroid-induced.  Patient with no prior history of diabetes.  Hemoglobin A1c pending.  Sliding scale insulin while on steroids.  Follow.    DVT prophylaxis: SCD/ambulation Code Status: Full Family Communication: Updated patient and mother at bedside.  Disposition:   Status is: Inpatient    Dispo: The patient is from: Home              Anticipated d/c is to: Home              Anticipated d/c date is: 07/11/2020              Patient currently currently on IV Solu-Medrol, not stable for discharge.       Consultants:   Neurology: Dr. Amada Jupiter 07/07/2020  Procedures:   MRI brain 07/06/2020  MRI orbits 07/06/2020  MRI of the T and C-spine 07/07/2020    Antimicrobials:   IV Rocephin 07/07/2020   Subjective: Sitting up in bed.  Mother at bedside.  Patient denies any chest pain.  No shortness of breath.  Feels diplopia is improving.  Still with some complaints of blurry vision when she looks to the left.  Denies any dysuria.  Overall feeling better than on admission.   Objective: Vitals:   07/07/20 1333 07/07/20 2056 07/08/20 8119 07/08/20 1458  BP: 119/68 112/69 (!) 99/54 110/72  Pulse: 84 73 76 60  Resp: 18 18 18    Temp: 98.9 F (37.2 C) 98 F (36.7 C) 98 F (36.7 C) 98.4 F (36.9 C)  TempSrc: Oral Oral Oral Oral  SpO2: 97% 98% 98% 99%  Weight:      Height:        Intake/Output Summary (Last 24 hours) at 07/08/2020 1630 Last data filed at 07/08/2020 1459 Gross per 24 hour  Intake 2951.2 ml  Output --  Net 2951.2 ml   Filed Weights   07/06/20 1626  Weight: 68 kg     Examination:  General exam: NAD Respiratory system: CTAB.  No wheezes, no crackles, no rhonchi.  Normal respiratory effort.   Cardiovascular system: Regular rate rhythm no murmurs rubs or gallops.  No JVD.  No lower extremity edema.  Gastrointestinal system: Abdomen is soft, nontender, nondistended, positive bowel sounds.  No rebound.  No guarding. Central nervous system: Alert and oriented.  Patient with a slight left decreased abduction of the left eye otherwise the rest of the cranial nerves II through XII grossly intact.  Extremities: Symmetric 5 x 5 power. Skin: No rashes, lesions or ulcers Psychiatry: Judgement and insight appear normal. Mood & affect appropriate.     Data Reviewed: I have personally reviewed following labs and imaging studies  CBC: Recent Labs  Lab 07/06/20 1731 07/07/20 0945 07/08/20 0848  WBC 8.7 7.3 11.7*  NEUTROABS  --  6.7 10.6*  HGB 13.0 13.5 13.3  HCT 39.5 40.1 40.3  MCV 91.4 90.3 91.8  PLT 290 297 303    Basic Metabolic Panel: Recent Labs  Lab 07/06/20 1731 07/07/20 0945 07/08/20 0848  NA 137 139 139  K 3.6 4.2 4.1  CL 107 109 110  CO2 22 16* 20*  GLUCOSE 98 220* 148*  BUN 15 19 17   CREATININE 0.76 0.78 0.66  CALCIUM 9.1 9.7 8.6*  MG  --   --  2.2    GFR: Estimated Creatinine Clearance: 93 mL/min (by C-G formula based on SCr of 0.66 mg/dL).  Liver Function Tests: Recent Labs  Lab 07/06/20 1731  AST 17  ALT 15  ALKPHOS 48  BILITOT 0.8  PROT 7.8  ALBUMIN 4.5    CBG: Recent Labs  Lab 07/07/20 1711 07/08/20 0807 07/08/20 1144  GLUCAP 142* 129* 159*     Recent Results (from the past 240 hour(s))  Respiratory Panel by RT PCR (Flu A&B, Covid) - Nasopharyngeal Swab     Status: None   Collection Time: 07/06/20  5:32 PM   Specimen: Nasopharyngeal Swab  Result Value Ref Range Status   SARS Coronavirus 2 by RT PCR NEGATIVE NEGATIVE Final    Comment: (NOTE) SARS-CoV-2 target nucleic acids are NOT DETECTED.  The  SARS-CoV-2 RNA is generally detectable in upper respiratoy specimens during the acute phase of infection. The lowest concentration of SARS-CoV-2 viral copies this assay can detect is 131 copies/mL. A negative result does not preclude SARS-Cov-2 infection and should not be used as the sole basis for treatment or other patient management decisions. A negative result may occur with  improper specimen collection/handling, submission of specimen other than nasopharyngeal swab, presence of viral mutation(s) within the areas targeted by this assay, and inadequate number of viral copies (<131 copies/mL). A negative result must be combined with clinical observations, patient history, and epidemiological information. The expected result is Negative.  Fact Sheet for Patients:  07/10/20  Fact Sheet for Healthcare  Providers:  https://www.young.biz/  This test is no t yet approved or cleared by the [place] and  has been authorized for detection and/or diagnosis of SARS-CoV-2 by FDA under an Emergency Use Authorization (EUA). This EUA will remain  in effect (meaning this test can be used) for the duration of the COVID-19 declaration under Section 564(b)(1) of the Act, 21 U.S.C. section 360bbb-3(b)(1), unless the authorization is terminated or revoked sooner.     Influenza A by PCR NEGATIVE NEGATIVE Final   Influenza B by PCR NEGATIVE NEGATIVE Final    Comment: (NOTE) The Xpert Xpress SARS-CoV-2/FLU/RSV assay is intended as an aid in  the diagnosis of influenza from Nasopharyngeal swab specimens and  should not be used as a sole basis for treatment. Nasal washings and  aspirates are unacceptable for Xpert Xpress SARS-CoV-2/FLU/RSV  testing.  Fact Sheet for Patients: https://www.moore.com/  Fact Sheet for Healthcare Providers: https://www.young.biz/  This test is not yet approved or cleared  by the [place] FDA and  has been authorized for detection and/or diagnosis of SARS-CoV-2 by  FDA under an Emergency Use Authorization (EUA). This EUA will remain  in effect (meaning this test can be used) for the duration of the  Covid-19 declaration under Section 564(b)(1) of the Act, 21  U.S.C. section 360bbb-3(b)(1), unless the authorization is  terminated or revoked. Performed at [facility], [address], [place], [place] [address]   Culture, Urine     Status: None   Collection Time: 07/07/20  1:11 PM   Specimen: Urine, Catheterized  Result Value Ref Range Status   Specimen Description   Final    URINE, CATHETERIZED Performed at [facility], [address] [place], [place] [address]    Special Requests   Final    NONE Performed at [facility] [address], [place], [place] [address]    Culture   Final    NO GROWTH Performed at [facility], [address] [place], [place] [address]    Report Status 07/08/2020 FINAL  Final         Radiology Studies: MR Brain W and Wo Contrast  Result Date: 07/06/2020 CLINICAL DATA:  Blurry vision and headache EXAM: MRI HEAD AND ORBITS WITHOUT AND WITH CONTRAST TECHNIQUE: Multiplanar, multiecho pulse sequences of the brain and surrounding structures were obtained without and with intravenous contrast. Multiplanar, multiecho pulse sequences of the orbits and surrounding structures were obtained including fat saturation techniques, before and after intravenous contrast administration. CONTRAST:  78mL GADAVIST GADOBUTROL 1 MMOL/ML IV SOLN COMPARISON:  None. FINDINGS: MRI HEAD FINDINGS Brain: No acute infarct, acute hemorrhage or extra-axial collection. Numerous bilateral white matter lesions, many of which are oriented perpendicularly to the long axis of the lateral ventricles. Normal volume of CSF spaces. No chronic microhemorrhage. Normal midline structures. There are  multiple bilateral supratentorial white matter lesions with contrast enhancement. The largest lesions are in the right temporal lobe, left frontal lobe and right frontal lobe. Vascular: Normal flow voids. Skull and upper cervical spine: Possible white matter lesion at the cervicomedullary junction. Other: None MRI ORBITS FINDINGS Orbits: No traumatic or inflammatory finding. Globes, optic nerves, orbital fat, extraocular muscles, vascular structures, and lacrimal glands are normal. Visualized sinuses: Clear. Soft tissues: Negative. IMPRESSION: 1. Multiple active demyelinating lesions of the supratentorial white matter. Multiple sclerosis is most likely. 2. Possible demyelinating lesion of the upper cervical spine. Dedicated cervical spine imaging recommended. 3. Normal MRI of  the orbits. Electronically Signed   By: Deatra Robinson M.D.   On: 07/06/2020 19:25   MR CERVICAL SPINE WO CONTRAST  Result Date: 07/07/2020 CLINICAL DATA:  Multiple sclerosis. EXAM: MRI CERVICAL AND THORACIC SPINE WITHOUT CONTRAST TECHNIQUE: Multiplanar and multiecho pulse sequences of the cervical spine, to include the craniocervical junction and cervicothoracic junction, and the thoracic spine, were obtained without intravenous contrast. COMPARISON:  None. FINDINGS: MRI CERVICAL SPINE FINDINGS Alignment: Straightening of the cervical curvature. Vertebrae: No fracture, evidence of discitis, or bone lesion. Cord: Foci of subtle increased T2 signal within the cervical cord at the C2-3 level posteriorly and C3-4 level on the left. Posterior Fossa, vertebral arteries, paraspinal tissues: Negative. Disc levels: No significant disc protrusion, spinal canal or neural foraminal stenosis at any cervical level. MRI THORACIC SPINE FINDINGS Alignment:  Physiologic. Vertebrae: No fracture, evidence of discitis, or bone lesion. Cord: T2 hyperintense lesions are seen at the T1-2 level anteriorly, at T3 on the left, T3-4 on the right, T6 on the right and  anteriorly at its T11-12. Paraspinal and other soft tissues: Negative. Disc levels: No significant disc herniation, spinal canal or neural foraminal stenosis at any thoracic level. IMPRESSION: 1. Multiple foci of increased T2 signal within the cervical and thoracic cord consistent with the clinical diagnosis of multiple sclerosis. 2. No significant spinal canal or neural foraminal stenosis at any cervical or thoracic level. Electronically Signed   By: Baldemar Lenis M.D.   On: 07/07/2020 13:55   MR THORACIC SPINE WO CONTRAST  Result Date: 07/07/2020 CLINICAL DATA:  Multiple sclerosis. EXAM: MRI CERVICAL AND THORACIC SPINE WITHOUT CONTRAST TECHNIQUE: Multiplanar and multiecho pulse sequences of the cervical spine, to include the craniocervical junction and cervicothoracic junction, and the thoracic spine, were obtained without intravenous contrast. COMPARISON:  None. FINDINGS: MRI CERVICAL SPINE FINDINGS Alignment: Straightening of the cervical curvature. Vertebrae: No fracture, evidence of discitis, or bone lesion. Cord: Foci of subtle increased T2 signal within the cervical cord at the C2-3 level posteriorly and C3-4 level on the left. Posterior Fossa, vertebral arteries, paraspinal tissues: Negative. Disc levels: No significant disc protrusion, spinal canal or neural foraminal stenosis at any cervical level. MRI THORACIC SPINE FINDINGS Alignment:  Physiologic. Vertebrae: No fracture, evidence of discitis, or bone lesion. Cord: T2 hyperintense lesions are seen at the T1-2 level anteriorly, at T3 on the left, T3-4 on the right, T6 on the right and anteriorly at its T11-12. Paraspinal and other soft tissues: Negative. Disc levels: No significant disc herniation, spinal canal or neural foraminal stenosis at any thoracic level. IMPRESSION: 1. Multiple foci of increased T2 signal within the cervical and thoracic cord consistent with the clinical diagnosis of multiple sclerosis. 2. No significant  spinal canal or neural foraminal stenosis at any cervical or thoracic level. Electronically Signed   By: Baldemar Lenis M.D.   On: 07/07/2020 13:55   MR ORBITS W WO CONTRAST  Result Date: 07/06/2020 CLINICAL DATA:  Blurry vision and headache EXAM: MRI HEAD AND ORBITS WITHOUT AND WITH CONTRAST TECHNIQUE: Multiplanar, multiecho pulse sequences of the brain and surrounding structures were obtained without and with intravenous contrast. Multiplanar, multiecho pulse sequences of the orbits and surrounding structures were obtained including fat saturation techniques, before and after intravenous contrast administration. CONTRAST:  28mL GADAVIST GADOBUTROL 1 MMOL/ML IV SOLN COMPARISON:  None. FINDINGS: MRI HEAD FINDINGS Brain: No acute infarct, acute hemorrhage or extra-axial collection. Numerous bilateral white matter lesions, many of which are oriented perpendicularly to  the long axis of the lateral ventricles. Normal volume of CSF spaces. No chronic microhemorrhage. Normal midline structures. There are multiple bilateral supratentorial white matter lesions with contrast enhancement. The largest lesions are in the right temporal lobe, left frontal lobe and right frontal lobe. Vascular: Normal flow voids. Skull and upper cervical spine: Possible white matter lesion at the cervicomedullary junction. Other: None MRI ORBITS FINDINGS Orbits: No traumatic or inflammatory finding. Globes, optic nerves, orbital fat, extraocular muscles, vascular structures, and lacrimal glands are normal. Visualized sinuses: Clear. Soft tissues: Negative. IMPRESSION: 1. Multiple active demyelinating lesions of the supratentorial white matter. Multiple sclerosis is most likely. 2. Possible demyelinating lesion of the upper cervical spine. Dedicated cervical spine imaging recommended. 3. Normal MRI of the orbits. Electronically Signed   By: Deatra Robinson M.D.   On: 07/06/2020 19:25        Scheduled Meds: . enoxaparin  (LOVENOX) injection  40 mg Subcutaneous Q24H  . famotidine  20 mg Oral Daily  . insulin aspart  0-15 Units Subcutaneous TID WC  . prenatal vitamin w/FE, FA  1 tablet Oral Q1200  . sodium chloride flush  3 mL Intravenous Q12H   Continuous Infusions: . sodium chloride    . cefTRIAXone (ROCEPHIN)  IV 1 g (07/08/20 1353)  . methylPREDNISolone (SOLU-MEDROL) injection 1,000 mg (07/07/20 2256)     LOS: 2 days    Time spent: 35 minutes    Ramiro Harvest, MD [facility]   To contact the attending provider between 7A-7P or the covering provider during after hours 7P-7A, please log into the web site [email or web address] and access using universal [place] password for that web site. If you do not have the password, please call the hospital operator.  07/08/2020, 4:30 PM

## 2020-07-09 DIAGNOSIS — R8271 Bacteriuria: Secondary | ICD-10-CM

## 2020-07-09 DIAGNOSIS — G35 Multiple sclerosis: Principal | ICD-10-CM

## 2020-07-09 LAB — BASIC METABOLIC PANEL
Anion gap: 11 (ref 5–15)
BUN: 19 mg/dL (ref 6–20)
CO2: 18 mmol/L — ABNORMAL LOW (ref 22–32)
Calcium: 8.5 mg/dL — ABNORMAL LOW (ref 8.9–10.3)
Chloride: 107 mmol/L (ref 98–111)
Creatinine, Ser: 0.64 mg/dL (ref 0.44–1.00)
GFR, Estimated: >60 mL/min (ref >60)
Glucose, Bld: 144 mg/dL — ABNORMAL HIGH (ref 70–99)
Potassium: 3.9 mmol/L (ref 3.5–5.1)
Sodium: 136 mmol/L (ref 135–145)

## 2020-07-09 LAB — HEMOGLOBIN A1C
Hgb A1c MFr Bld: 5.4 % (ref 4.8–5.6)
Mean Plasma Glucose: 108 mg/dL

## 2020-07-09 LAB — GLUCOSE, CAPILLARY
Glucose-Capillary: 132 mg/dL — ABNORMAL HIGH (ref 70–99)
Glucose-Capillary: 132 mg/dL — ABNORMAL HIGH (ref 70–99)
Glucose-Capillary: 116 mg/dL — ABNORMAL HIGH (ref 70–99)
Glucose-Capillary: 132 mg/dL — ABNORMAL HIGH (ref 70–99)

## 2020-07-09 MED ORDER — SODIUM BICARBONATE 650 MG PO TABS
650.0000 mg | ORAL_TABLET | Freq: Two times a day (BID) | ORAL | Status: DC
  Administered 2020-07-09 – 2020-07-10 (×3): 650 mg via ORAL
  Filled 2020-07-09 (×3): qty 1

## 2020-07-09 NOTE — Progress Notes (Signed)
PROGRESS NOTE    Natalie Zimmerman  CHY:[id number] DOB: 1991-08-03 DOA: 07/06/2020 PCP: Patient, No Pcp Per    Chief Complaint  Patient presents with   Blurred Vision   Diplopia   Headache    Brief Narrative:  Patient 29 year old female history of polycystic ovarian disease presented to the ED with 5 days of diplopia, headaches which have been waxing and waning.  Patient works in the pediatrician's office was assessed by a coworker who noticed patient's eyes were not tracking normally and recommended patient present to the ED.  Patient seen in the ED beta hCG was positive, urinalysis concerning for UTI, MRI of the head done showed a demyelinating lesions concerning for multiple sclerosis.  Neurology consulted who recommended inpatient admission for high-dose IV steroids.  Neurology also consulted formally on 07/07/2020.   Assessment & Plan:   Principal Problem:   Multiple sclerosis (HCC) Active Problems:   Demyelinating changes in brain [facility])   Diplopia   Pregnancy   Acute lower UTI   Hyperglycemia  1 newly diagnosed multiple sclerosis/diplopia/demyelinating lesions seen on MRI Patient presented with diplopia, also noted to have some slight decreased abduction of the left eye.  MRI brain done with demyelinating lesions concerning for multiple sclerosis.  MRI of the C and T-spine with multiple foci of increased T2 signal within the cervical and thoracic cord consistent with clinical diagnosis of multiple sclerosis.  No significant spinal canal or neuroforaminal stenosis at any cervical or thoracic level.  Patient seen in consultation by neuro hospitalist who recommended high-dose Solu-Medrol 1 g daily x5 days.  Continue Pepcid.  Clinical improvement.  Outpatient follow-up with neurology, Dr. Epimenio Foot.  Neurology following and appreciate input and recommendations.   2.  Pregnancy Urine pregnancy noted to be positive.  Patient started on prenatal vitamins.  Outpatient follow-up with  OB/GYN.  3.  Abnormal urinalysis/bacteriuria Urinalysis done concerning for UTI.  Urine was cloudy, large leukocytes, nitrite negative, greater than 50 WBCs, many bacteria.  Urine culture negative.  DC IV Rocephin.  Follow.   4.  Hyperglycemia Likely steroid-induced.  Patient with no prior history of diabetes.  Hemoglobin A1c 5.4.  SSI.    DVT prophylaxis: SCD/ambulation Code Status: Full Family Communication: Updated patient and father at bedside.  Disposition:   Status is: Inpatient    Dispo: The patient is from: Home              Anticipated d/c is to: Home              Anticipated d/c date is: 07/10/2020              Patient currently currently on IV Solu-Medrol, not stable for discharge.       Consultants:   Neurology: Dr. Amada Jupiter 07/07/2020  Procedures:   MRI brain 07/06/2020  MRI orbits 07/06/2020  MRI of the T and C-spine 07/07/2020    Antimicrobials:   IV Rocephin 07/07/2020>>>> 07/09/2020   Subjective: Sitting up in bed.  Father at bedside.  States diplopia has resolved.  Blurriness improving however states when she walked down and looked out the window still had some blurry vision looking out however has slowly improved since admission.  No chest pain.  No shortness of breath.  No dysuria.  Overall feels better.   Objective: Vitals:   07/07/20 2056 07/08/20 0608 07/08/20 1458 07/09/20 0504  BP: 112/69 (!) 99/54 110/72 113/72  Pulse: 73 76 60 (!) 55  Resp: 18 18  16   Temp: 98  F (36.7 C) 98 F (36.7 C) 98.4 F (36.9 C) 98.3 F (36.8 C)  TempSrc: Oral Oral Oral Oral  SpO2: 98% 98% 99% 100%  Weight:      Height:        Intake/Output Summary (Last 24 hours) at 07/09/2020 1236 Last data filed at 07/09/2020 0600 Gross per 24 hour  Intake 398 ml  Output 0 ml  Net 398 ml   Filed Weights   07/06/20 1626  Weight: 68 kg    Examination:  General exam: NAD.  Respiratory system: Lungs clear to auscultation bilaterally.  No wheezes, no  crackles, no rhonchi.  Normal respiratory effort.  Cardiovascular system: RRR no murmurs rubs or gallops.  No JVD.  No lower extremity edema.   Gastrointestinal system: Abdomen soft, nontender, nondistended, positive bowel sounds.  No rebound.  No guarding.  Central nervous system: Alert and oriented.  Patient with a slight left decreased abduction of the left eye otherwise the rest of the cranial nerves II through XII grossly intact.  Extremities: Symmetric 5 x 5 power. Skin: No rashes, lesions or ulcers Psychiatry: Judgement and insight appear normal. Mood & affect appropriate.     Data Reviewed: I have personally reviewed following labs and imaging studies  CBC: Recent Labs  Lab 07/06/20 1731 07/07/20 0945 07/08/20 0848  WBC 8.7 7.3 11.7*  NEUTROABS  --  6.7 10.6*  HGB 13.0 13.5 13.3  HCT 39.5 40.1 40.3  MCV 91.4 90.3 91.8  PLT 290 297 303    Basic Metabolic Panel: Recent Labs  Lab 07/06/20 1731 07/07/20 0945 07/08/20 0848 07/09/20 0427  NA 137 139 139 136  K 3.6 4.2 4.1 3.9  CL 107 109 110 107  CO2 22 16* 20* 18*  GLUCOSE 98 220* 148* 144*  BUN 15 19 17 19   CREATININE 0.76 0.78 0.66 0.64  CALCIUM 9.1 9.7 8.6* 8.5*  MG  --   --  2.2  --     GFR: Estimated Creatinine Clearance: 93 mL/min (by C-G formula based on SCr of 0.64 mg/dL).  Liver Function Tests: Recent Labs  Lab 07/06/20 1731  AST 17  ALT 15  ALKPHOS 48  BILITOT 0.8  PROT 7.8  ALBUMIN 4.5    CBG: Recent Labs  Lab 07/08/20 1144 07/08/20 1629 07/08/20 2114 07/09/20 0725 07/09/20 1125  GLUCAP 159* 102* 124* 132* 132*     Recent Results (from the past 240 hour(s))  Respiratory Panel by RT PCR (Flu A&B, Covid) - Nasopharyngeal Swab     Status: None   Collection Time: 07/06/20  5:32 PM   Specimen: Nasopharyngeal Swab  Result Value Ref Range Status   SARS Coronavirus 2 by RT PCR NEGATIVE NEGATIVE Final    Comment: (NOTE) SARS-CoV-2 target nucleic acids are NOT DETECTED.  The  SARS-CoV-2 RNA is generally detectable in upper respiratoy specimens during the acute phase of infection. The lowest concentration of SARS-CoV-2 viral copies this assay can detect is 131 copies/mL. A negative result does not preclude SARS-Cov-2 infection and should not be used as the sole basis for treatment or other patient management decisions. A negative result may occur with  improper specimen collection/handling, submission of specimen other than nasopharyngeal swab, presence of viral mutation(s) within the areas targeted by this assay, and inadequate number of viral copies (<131 copies/mL). A negative result must be combined with clinical observations, patient history, and epidemiological information. The expected result is Negative.  Fact Sheet for Patients:  07/08/20  Fact Sheet  for Healthcare Providers:  https://www.young.biz/  This test is no t yet approved or cleared by the [place] and  has been authorized for detection and/or diagnosis of SARS-CoV-2 by FDA under an Emergency Use Authorization (EUA). This EUA will remain  in effect (meaning this test can be used) for the duration of the COVID-19 declaration under Section 564(b)(1) of the Act, 21 U.S.C. section 360bbb-3(b)(1), unless the authorization is terminated or revoked sooner.     Influenza A by PCR NEGATIVE NEGATIVE Final   Influenza B by PCR NEGATIVE NEGATIVE Final    Comment: (NOTE) The Xpert Xpress SARS-CoV-2/FLU/RSV assay is intended as an aid in  the diagnosis of influenza from Nasopharyngeal swab specimens and  should not be used as a sole basis for treatment. Nasal washings and  aspirates are unacceptable for Xpert Xpress SARS-CoV-2/FLU/RSV  testing.  Fact Sheet for Patients: https://www.moore.com/  Fact Sheet for Healthcare Providers: https://www.young.biz/  This test is not yet approved or cleared  by the [place] FDA and  has been authorized for detection and/or diagnosis of SARS-CoV-2 by  FDA under an Emergency Use Authorization (EUA). This EUA will remain  in effect (meaning this test can be used) for the duration of the  Covid-19 declaration under Section 564(b)(1) of the Act, 21  U.S.C. section 360bbb-3(b)(1), unless the authorization is  terminated or revoked. Performed at [facility], [address] [place], [place] [address]   Culture, Urine     Status: None   Collection Time: 07/07/20  1:11 PM   Specimen: Urine, Catheterized  Result Value Ref Range Status   Specimen Description   Final    URINE, CATHETERIZED Performed at [facility], [address], [place], [place] [address]    Special Requests   Final    NONE Performed at [facility], [address] [place], [place] [address]    Culture   Final    NO GROWTH Performed at [facility], [address], [place], [place] [address]    Report Status 07/08/2020 FINAL  Final         Radiology Studies: No results found.      Scheduled Meds:  enoxaparin (LOVENOX) injection  40 mg Subcutaneous Q24H   famotidine  20 mg Oral Daily   insulin aspart  0-15 Units Subcutaneous TID WC   prenatal vitamin w/FE, FA  1 tablet Oral Q1200   sodium bicarbonate  650 mg Oral BID   sodium chloride flush  3 mL Intravenous Q12H   Continuous Infusions:  sodium chloride     cefTRIAXone (ROCEPHIN)  IV 1 g (07/08/20 1353)   methylPREDNISolone (SOLU-MEDROL) injection 1,000 mg (07/08/20 2145)     LOS: 3 days    Time spent: 35 minutes    Ramiro Harvest, MD [facility]   To contact the attending provider between 7A-7P or the covering provider during after hours 7P-7A, please log into the web site [email or web address] and access using universal [place] password for that web site. If you do not have the password, please call the hospital  operator.  07/09/2020, 12:36 PM

## 2020-07-09 NOTE — Progress Notes (Signed)
Ms. Burson reports that her double vision continues to improve, though she still notices at distance some problems.  She denies any new symptoms.  She is awake, alert, interactive and appropriate.  EOM with mild blurring on far leftward gaze.  Impression: 28 year old with newly diagnosed MS based on separation of time and space of lesions due to enhancing and nonenhancing lesions in multiple areas of the nervous system.  The appearance is relatively characteristic on imaging.  She will finish her steroids tomorrow, and will need to follow-up with neurology as an outpatient for disease modifying therapy.   Ritta Slot, MD [facility] [phone]  If 7pm- 7am, please page neurology on call as listed in AMION.

## 2020-07-10 LAB — GLUCOSE, CAPILLARY
Glucose-Capillary: 127 mg/dL — ABNORMAL HIGH (ref 70–99)
Glucose-Capillary: 164 mg/dL — ABNORMAL HIGH (ref 70–99)

## 2020-07-10 LAB — BASIC METABOLIC PANEL
Anion gap: 9 (ref 5–15)
BUN: 16 mg/dL (ref 6–20)
CO2: 23 mmol/L (ref 22–32)
Calcium: 8.6 mg/dL — ABNORMAL LOW (ref 8.9–10.3)
Chloride: 107 mmol/L (ref 98–111)
Creatinine, Ser: 0.65 mg/dL (ref 0.44–1.00)
GFR, Estimated: >60 mL/min (ref >60)
Glucose, Bld: 138 mg/dL — ABNORMAL HIGH (ref 70–99)
Potassium: 4.3 mmol/L (ref 3.5–5.1)
Sodium: 139 mmol/L (ref 135–145)

## 2020-07-10 MED ORDER — FAMOTIDINE 20 MG PO TABS: 20.0000 mg | ORAL_TABLET | Freq: Every day | ORAL | 0 refills | Status: DC

## 2020-07-10 MED ORDER — ACETAMINOPHEN 325 MG PO TABS: 650.0000 mg | ORAL_TABLET | Freq: Four times a day (QID) | ORAL | Status: DC | PRN

## 2020-07-10 NOTE — Progress Notes (Signed)
Assessment unchanged. Pt verbalized understanding of dc instructions including meds and follow-up care. Discharged via wc to front entrance accompanied by sister and NT.

## 2020-07-10 NOTE — Discharge Summary (Signed)
Physician Discharge Summary  Natalie Zimmerman NGE:[id number] DOB: 20-Apr-1991 DOA: 07/06/2020  PCP: Patient, No Pcp Per  Admit date: 07/06/2020 Discharge date: 07/10/2020  Time spent: 50 minutes  Recommendations for Outpatient Follow-up:  1. Follow-up with Dr. Epimenio Foot, neurology.  Office will call with appointment time for follow-up. 2. Follow-up with OB/GYN for prenatal care. 3. Follow-up with PCP in 2 weeks.   Discharge Diagnoses:  Principal Problem:   Multiple sclerosis (HCC) Active Problems:   Demyelinating changes in brain [facility])   Diplopia   Pregnancy   Acute lower UTI   Hyperglycemia   Discharge Condition: Stable and improved  Diet recommendation: Regular  Filed Weights   07/06/20 1626  Weight: 68 kg    History of present illness:  HPI per Dr Debby Bud Ferrel Logan is a 29 y.o. female with medical history significant of polycystic ovary disease presents today with 5 days of diplopia. Patient states she noted some blurring of vision when looking to the left last Thursday then noted double vision in all visual fields worse with distance. This has been constant. She has had some headache waxing and waning since the symptoms began. She describes it as being to the left side of her forehead. She took ibuprofen this afternoon with some mild relief. Pain is moderate. She does not typically have headaches. She denies fever, chills, neck pain, nausea, vomiting, diarrhea. She works in a [employer]. She was seen by her optomatrist yesterday and they thought it might be secondary to her prescription. At her pediatricians office, a friend and co-worker had office RN see her who noticed on exam that her eyes were not tracking normally. She was advised to come to the ED for further evaluation.  ED Course: T 98,2  125/76  HR 102  RR 17. On ED-P exam she had decrease abduction left eye. Cmet -nl, CBC - nl, U/A with many squamous cells and bacturia ( not clean catch). BHCG  positive. COVID-19 NEGATIVE. MRI with demylenating lesions, also of the C-Spine. Dr. Orpah Clinton, hospital neurologist, was consulted, who recommended admission and high dose IV steroids. [facility] called to admit patient.   Hospital Course:  1 newly diagnosed multiple sclerosis/diplopia/demyelinating lesions seen on MRI Patient presented with diplopia, also noted to have some slight decreased abduction of the left eye.  MRI brain done with demyelinating lesions concerning for multiple sclerosis.  MRI of the C and T-spine with multiple foci of increased T2 signal within the cervical and thoracic cord consistent with clinical diagnosis of multiple sclerosis.  No significant spinal canal or neuroforaminal stenosis at any cervical or thoracic level.  Patient seen in consultation by neuro hospitalist who recommended high-dose Solu-Medrol 1 g daily x5 days.  Patient also placed on Pepcid.  Per neurology patient with newly diagnosed MS based on separation of time and space of lesions due to enhancing and nonenhancing lesions in multiple areas of the nervous system.   Patient improved clinically.  Diplopia had resolved by day of discharge.  Blurriness had improved significantly. Outpatient follow-up with neurology, Dr. Epimenio Foot.    2.  Pregnancy Urine pregnancy noted to be positive.  Patient started on prenatal vitamins.  Outpatient follow-up with OB/GYN.  3.  Abnormal urinalysis/bacteriuria Urinalysis done concerning for UTI.  Urine was cloudy, large leukocytes, nitrite negative, greater than 50 WBCs, many bacteria.    Patient initially placed on IV Rocephin pending urine cultures.  Urine cultures were negative.  IV Rocephin discontinued.  Patient received 2 doses of Rocephin.  Outpatient  follow-up.    4.  Hyperglycemia Likely steroid-induced.  Patient with no prior history of diabetes.  Hemoglobin A1c 5.4.  Patient was maintained on sliding scale insulin.  Outpatient follow-up.    Procedures:  MRI brain  07/06/2020  MRI orbits 07/06/2020  MRI of the T and C-spine 07/07/2020  Consultations:  Neurology: Dr. Amada Jupiter 07/07/2020  Discharge Exam: Vitals:   07/09/20 2136 07/10/20 0452  BP: 124/81 119/71  Pulse: (!) 54 74  Resp:  18  Temp: 98 F (36.7 C) 98 F (36.7 C)  SpO2: 97% 98%    General: NAD Cardiovascular: RRR Respiratory: CTAB  Discharge Instructions   Discharge Instructions    Ambulatory referral to Neurology   Complete by: As directed    An appointment is requested in approximately: within 2 weeks: Newly diagnosed MS in pregnant patient   Diet general   Complete by: As directed    Increase activity slowly   Complete by: As directed      Allergies as of 07/10/2020   No Known Allergies     Medication List    STOP taking these medications   ibuprofen 600 MG tablet Commonly known as: ADVIL     TAKE these medications   acetaminophen 325 MG tablet Commonly known as: TYLENOL Take 2 tablets (650 mg total) by mouth every 6 (six) hours as needed for mild pain (or Fever >/= 101).   famotidine 20 MG tablet Commonly known as: PEPCID Take 1 tablet (20 mg total) by mouth daily for 7 days. Start taking on: July 11, 2020   prenatal vitamin w/FE, FA 27-1 MG Tabs tablet Take 1 tablet by mouth daily at 12 noon.      No Known Allergies  Follow-up Information    Sater, Pearletha Furl, MD. Schedule an appointment as soon as possible for a visit in 2 week(s).   Specialty: Neurology Why: office will call with appointment time for follow up Contact information: [address] [place] [address] [phone]        obgyn. Schedule an appointment as soon as possible for a visit in 2 week(s).   Why: f/u in 1-2 weeks.       PCP. Schedule an appointment as soon as possible for a visit in 2 week(s).                The results of significant diagnostics from this hospitalization (including imaging, microbiology, ancillary and laboratory) are listed  below for reference.    Significant Diagnostic Studies: MR Brain W and Wo Contrast  Result Date: 07/06/2020 CLINICAL DATA:  Blurry vision and headache EXAM: MRI HEAD AND ORBITS WITHOUT AND WITH CONTRAST TECHNIQUE: Multiplanar, multiecho pulse sequences of the brain and surrounding structures were obtained without and with intravenous contrast. Multiplanar, multiecho pulse sequences of the orbits and surrounding structures were obtained including fat saturation techniques, before and after intravenous contrast administration. CONTRAST:  21mL GADAVIST GADOBUTROL 1 MMOL/ML IV SOLN COMPARISON:  None. FINDINGS: MRI HEAD FINDINGS Brain: No acute infarct, acute hemorrhage or extra-axial collection. Numerous bilateral white matter lesions, many of which are oriented perpendicularly to the long axis of the lateral ventricles. Normal volume of CSF spaces. No chronic microhemorrhage. Normal midline structures. There are multiple bilateral supratentorial white matter lesions with contrast enhancement. The largest lesions are in the right temporal lobe, left frontal lobe and right frontal lobe. Vascular: Normal flow voids. Skull and upper cervical spine: Possible white matter lesion at the cervicomedullary junction. Other: None MRI ORBITS  FINDINGS Orbits: No traumatic or inflammatory finding. Globes, optic nerves, orbital fat, extraocular muscles, vascular structures, and lacrimal glands are normal. Visualized sinuses: Clear. Soft tissues: Negative. IMPRESSION: 1. Multiple active demyelinating lesions of the supratentorial white matter. Multiple sclerosis is most likely. 2. Possible demyelinating lesion of the upper cervical spine. Dedicated cervical spine imaging recommended. 3. Normal MRI of the orbits. Electronically Signed   By: Deatra Robinson M.D.   On: 07/06/2020 19:25   MR CERVICAL SPINE WO CONTRAST  Result Date: 07/07/2020 CLINICAL DATA:  Multiple sclerosis. EXAM: MRI CERVICAL AND THORACIC SPINE WITHOUT CONTRAST  TECHNIQUE: Multiplanar and multiecho pulse sequences of the cervical spine, to include the craniocervical junction and cervicothoracic junction, and the thoracic spine, were obtained without intravenous contrast. COMPARISON:  None. FINDINGS: MRI CERVICAL SPINE FINDINGS Alignment: Straightening of the cervical curvature. Vertebrae: No fracture, evidence of discitis, or bone lesion. Cord: Foci of subtle increased T2 signal within the cervical cord at the C2-3 level posteriorly and C3-4 level on the left. Posterior Fossa, vertebral arteries, paraspinal tissues: Negative. Disc levels: No significant disc protrusion, spinal canal or neural foraminal stenosis at any cervical level. MRI THORACIC SPINE FINDINGS Alignment:  Physiologic. Vertebrae: No fracture, evidence of discitis, or bone lesion. Cord: T2 hyperintense lesions are seen at the T1-2 level anteriorly, at T3 on the left, T3-4 on the right, T6 on the right and anteriorly at its T11-12. Paraspinal and other soft tissues: Negative. Disc levels: No significant disc herniation, spinal canal or neural foraminal stenosis at any thoracic level. IMPRESSION: 1. Multiple foci of increased T2 signal within the cervical and thoracic cord consistent with the clinical diagnosis of multiple sclerosis. 2. No significant spinal canal or neural foraminal stenosis at any cervical or thoracic level. Electronically Signed   By: Baldemar Lenis M.D.   On: 07/07/2020 13:55   MR THORACIC SPINE WO CONTRAST  Result Date: 07/07/2020 CLINICAL DATA:  Multiple sclerosis. EXAM: MRI CERVICAL AND THORACIC SPINE WITHOUT CONTRAST TECHNIQUE: Multiplanar and multiecho pulse sequences of the cervical spine, to include the craniocervical junction and cervicothoracic junction, and the thoracic spine, were obtained without intravenous contrast. COMPARISON:  None. FINDINGS: MRI CERVICAL SPINE FINDINGS Alignment: Straightening of the cervical curvature. Vertebrae: No fracture, evidence  of discitis, or bone lesion. Cord: Foci of subtle increased T2 signal within the cervical cord at the C2-3 level posteriorly and C3-4 level on the left. Posterior Fossa, vertebral arteries, paraspinal tissues: Negative. Disc levels: No significant disc protrusion, spinal canal or neural foraminal stenosis at any cervical level. MRI THORACIC SPINE FINDINGS Alignment:  Physiologic. Vertebrae: No fracture, evidence of discitis, or bone lesion. Cord: T2 hyperintense lesions are seen at the T1-2 level anteriorly, at T3 on the left, T3-4 on the right, T6 on the right and anteriorly at its T11-12. Paraspinal and other soft tissues: Negative. Disc levels: No significant disc herniation, spinal canal or neural foraminal stenosis at any thoracic level. IMPRESSION: 1. Multiple foci of increased T2 signal within the cervical and thoracic cord consistent with the clinical diagnosis of multiple sclerosis. 2. No significant spinal canal or neural foraminal stenosis at any cervical or thoracic level. Electronically Signed   By: Baldemar Lenis M.D.   On: 07/07/2020 13:55   MR ORBITS W WO CONTRAST  Result Date: 07/06/2020 CLINICAL DATA:  Blurry vision and headache EXAM: MRI HEAD AND ORBITS WITHOUT AND WITH CONTRAST TECHNIQUE: Multiplanar, multiecho pulse sequences of the brain and surrounding structures were obtained without and with  intravenous contrast. Multiplanar, multiecho pulse sequences of the orbits and surrounding structures were obtained including fat saturation techniques, before and after intravenous contrast administration. CONTRAST:  50mL GADAVIST GADOBUTROL 1 MMOL/ML IV SOLN COMPARISON:  None. FINDINGS: MRI HEAD FINDINGS Brain: No acute infarct, acute hemorrhage or extra-axial collection. Numerous bilateral white matter lesions, many of which are oriented perpendicularly to the long axis of the lateral ventricles. Normal volume of CSF spaces. No chronic microhemorrhage. Normal midline structures.  There are multiple bilateral supratentorial white matter lesions with contrast enhancement. The largest lesions are in the right temporal lobe, left frontal lobe and right frontal lobe. Vascular: Normal flow voids. Skull and upper cervical spine: Possible white matter lesion at the cervicomedullary junction. Other: None MRI ORBITS FINDINGS Orbits: No traumatic or inflammatory finding. Globes, optic nerves, orbital fat, extraocular muscles, vascular structures, and lacrimal glands are normal. Visualized sinuses: Clear. Soft tissues: Negative. IMPRESSION: 1. Multiple active demyelinating lesions of the supratentorial white matter. Multiple sclerosis is most likely. 2. Possible demyelinating lesion of the upper cervical spine. Dedicated cervical spine imaging recommended. 3. Normal MRI of the orbits. Electronically Signed   By: Deatra Robinson M.D.   On: 07/06/2020 19:25    Microbiology: Recent Results (from the past 240 hour(s))  Respiratory Panel by RT PCR (Flu A&B, Covid) - Nasopharyngeal Swab     Status: None   Collection Time: 07/06/20  5:32 PM   Specimen: Nasopharyngeal Swab  Result Value Ref Range Status   SARS Coronavirus 2 by RT PCR NEGATIVE NEGATIVE Final    Comment: (NOTE) SARS-CoV-2 target nucleic acids are NOT DETECTED.  The SARS-CoV-2 RNA is generally detectable in upper respiratoy specimens during the acute phase of infection. The lowest concentration of SARS-CoV-2 viral copies this assay can detect is 131 copies/mL. A negative result does not preclude SARS-Cov-2 infection and should not be used as the sole basis for treatment or other patient management decisions. A negative result may occur with  improper specimen collection/handling, submission of specimen other than nasopharyngeal swab, presence of viral mutation(s) within the areas targeted by this assay, and inadequate number of viral copies (<131 copies/mL). A negative result must be combined with clinical observations,  patient history, and epidemiological information. The expected result is Negative.  Fact Sheet for Patients:  https://www.moore.com/  Fact Sheet for Healthcare Providers:  https://www.young.biz/  This test is no t yet approved or cleared by the [place] FDA and  has been authorized for detection and/or diagnosis of SARS-CoV-2 by FDA under an Emergency Use Authorization (EUA). This EUA will remain  in effect (meaning this test can be used) for the duration of the COVID-19 declaration under Section 564(b)(1) of the Act, 21 U.S.C. section 360bbb-3(b)(1), unless the authorization is terminated or revoked sooner.     Influenza A by PCR NEGATIVE NEGATIVE Final   Influenza B by PCR NEGATIVE NEGATIVE Final    Comment: (NOTE) The Xpert Xpress SARS-CoV-2/FLU/RSV assay is intended as an aid in  the diagnosis of influenza from Nasopharyngeal swab specimens and  should not be used as a sole basis for treatment. Nasal washings and  aspirates are unacceptable for Xpert Xpress SARS-CoV-2/FLU/RSV  testing.  Fact Sheet for Patients: https://www.moore.com/  Fact Sheet for Healthcare Providers: https://www.young.biz/  This test is not yet approved or cleared by the [place] FDA and  has been authorized for detection and/or diagnosis of SARS-CoV-2 by  FDA under an Emergency Use Authorization (EUA). This EUA will remain  in effect (meaning this  test can be used) for the duration of the  Covid-19 declaration under Section 564(b)(1) of the Act, 21  U.S.C. section 360bbb-3(b)(1), unless the authorization is  terminated or revoked. Performed at [facility], [address], [place], [place] [address]   Culture, Urine     Status: None   Collection Time: 07/07/20  1:11 PM   Specimen: Urine, Catheterized  Result Value Ref Range Status   Specimen Description   Final    URINE,  CATHETERIZED Performed at [facility], [address], [place], [place] [address]    Special Requests   Final    NONE Performed at [facility], [address] [place], [place] [address]    Culture   Final    NO GROWTH Performed at [facility], [address] [place], [place] [address]    Report Status 07/08/2020 FINAL  Final     Labs: Basic Metabolic Panel: Recent Labs  Lab 07/06/20 1731 07/07/20 0945 07/08/20 0848 07/09/20 0427 07/10/20 0444  NA 137 139 139 136 139  K 3.6 4.2 4.1 3.9 4.3  CL 107 109 110 107 107  CO2 22 16* 20* 18* 23  GLUCOSE 98 220* 148* 144* 138*  BUN 15 19 17 19 16   CREATININE 0.76 0.78 0.66 0.64 0.65  CALCIUM 9.1 9.7 8.6* 8.5* 8.6*  MG  --   --  2.2  --   --    Liver Function Tests: Recent Labs  Lab 07/06/20 1731  AST 17  ALT 15  ALKPHOS 48  BILITOT 0.8  PROT 7.8  ALBUMIN 4.5   No results for input(s): LIPASE, AMYLASE in the last 168 hours. No results for input(s): AMMONIA in the last 168 hours. CBC: Recent Labs  Lab 07/06/20 1731 07/07/20 0945 07/08/20 0848  WBC 8.7 7.3 11.7*  NEUTROABS  --  6.7 10.6*  HGB 13.0 13.5 13.3  HCT 39.5 40.1 40.3  MCV 91.4 90.3 91.8  PLT 290 297 303   Cardiac Enzymes: No results for input(s): CKTOTAL, CKMB, CKMBINDEX, TROPONINI in the last 168 hours. BNP: BNP (last 3 results) No results for input(s): BNP in the last 8760 hours.  ProBNP (last 3 results) No results for input(s): PROBNP in the last 8760 hours.  CBG: Recent Labs  Lab 07/09/20 0725 07/09/20 1125 07/09/20 1725 07/09/20 2136 07/10/20 0739  GLUCAP 132* 132* 116* 132* 127*       Signed:  Ramiro Harvest MD.  [facility] 07/10/2020, 11:18 AM

## 2020-07-15 ENCOUNTER — Ambulatory Visit (INDEPENDENT_AMBULATORY_CARE_PROVIDER_SITE_OTHER): Payer: BC Managed Care – PPO | Admitting: Neurology

## 2020-07-15 ENCOUNTER — Encounter: Payer: Self-pay | Admitting: Neurology

## 2020-07-15 ENCOUNTER — Telehealth: Payer: Self-pay | Admitting: *Deleted

## 2020-07-15 VITALS — BP 122/78 | HR 82 | Ht 62.0 in | Wt 149.0 lb

## 2020-07-15 DIAGNOSIS — G35 Multiple sclerosis: Secondary | ICD-10-CM

## 2020-07-15 DIAGNOSIS — R200 Anesthesia of skin: Secondary | ICD-10-CM

## 2020-07-15 DIAGNOSIS — E559 Vitamin D deficiency, unspecified: Secondary | ICD-10-CM | POA: Diagnosis not present

## 2020-07-15 DIAGNOSIS — H532 Diplopia: Secondary | ICD-10-CM

## 2020-07-15 NOTE — Telephone Encounter (Signed)
Submitted urgent PA on CMM. Key: [id number]. Waiting on determination from [employer].

## 2020-07-15 NOTE — Telephone Encounter (Signed)
Faxed completed/signed glatiramer acetate start form to [employer] at [phone]. Received fax confirmation. Dose: 40mg /ml SQ 3 times a week, dispense: 1 box of 12 syringes (30 days supply), refills: 1 yr Also give: whisperject autoinjector device for 1.74ml prefilled glass syringe

## 2020-07-15 NOTE — Progress Notes (Addendum)
[facility]  PATIENT: Natalie Zimmerman DOB: February 23, 1991  REFERRING DOCTOR OR PCP: Ramiro Harvest, MD SOURCE: Patient, notes from recent hospitalization, imaging and lab reports, MRI images personally reviewed.  _________________________________   HISTORICAL  CHIEF COMPLAINT:  Chief Complaint  Patient presents with  . New Patient (Initial Visit)    RM 13, alone. Internal referral from Rodolph Bong, MD at [facility] for MS. She was admitted at [facility] from 07/06/20-07/10/20. Presented to ED with diplopia x5 days/headache. Had MRI's completed/5days IV steroids. Eyesight back to normal per pt. Hands/feet going numb at night. Denies dizziness. Recently found out last week that she is pregnant. Goes next Friday for initial check up (Dr. Connye Burkitt at [facility]). They felt she was maybe only a couple weeks along. Has no family hx of MS.    HISTORY OF PRESENT ILLNESS:  I had the pleasure of seeing your patient, Natalie Zimmerman, at the [facility] at [facility] for neurologic consultation regarding her recent diagnosis of MS.  She is a 29 year old woman who had the onset of blurry vision when she looked to her left July 01, 2020.  This worsened over the next two days, over the weekend, especially looking at headlights while driving.   She saw ophthalmology 07/06/2020 and they suspected a neurologic disorder.   She was referred to the [facility] ED 07/06/2020.   MRIs of the brain and spine showed multiple lesions consistent with MS. Some of the foci enhanced after contrast consistent with more acute lesions in confirming the diagnosis. She was admitted and had 5 days of IV SOlu-Medrol.   Symptoms improved and she was discharged home.   While in the hospital, it was discovered that she was about 6 to [redacted] weeks pregnant.  Besides diplopia, she had no symptoms at the time.   However, in the past, she did note some numbness or a hot sensation in her feet but never > 1  day.   However, she had intermittent Lhermitte signs since July 2021.     She denies current numbness, weakness or ataxia.   Gait is fine and stairs are not a problem without holding the bannister.  Bladder function is fine.  She has more fatigue and finds she needs to go to bed earlier.    She is getting 8 hours of sleep.  Occasionally, she wakes up a couple times a night and may have trouble falling back asleep.   She snores but has no gasping/pauses.     Mood is fine.   Cognition is doing well.   She works as a Scientist, physiological.     She is otherwise healthy.   She is currently about 6-[redacted] weeks pregnant.    She sees OB/Gyn next week.  She has no FH of MS or autoimmune disease.     I personally reviewed MRIs of the brain and spine performed 10/26 and 07/07/2020  Imaging: MRI of the brain and orbits 07/06/2020 showed multiple T2/FLAIR hyperintense foci predominantly in the periventricular, juxtacortical and deep white matter of the hemispheres. Many of the periventricular foci are radially oriented to the ventricles.  At least 13 foci enhanced after contrast was administered.  The enhancing foci were very small and would not be expected to be symptomatic.Marland Kitchen There were just a couple small nonenhancing nonenhancing foci in the brainstem and cerebellum, none in a position that would be expected to cause diplopia. Optic nerves were normal.  MRI of the cervical and  thoracic spine performed 07/07/2020 showed T2 hyperintense foci posteriorly at C2-C3 and towards the left at C3-C4, anteriorly at T1-T2, to the left at T3, to the right at T3-T4, essentially at T5, to the anterolateral right at T6 and centrally at T11-T12. Due to her pregnancy, the study was done without contrast.  REVIEW OF SYSTEMS: Constitutional: No fevers, chills, sweats, or change in appetite Eyes: No visual changes, double vision, eye pain Ear, nose and throat: No hearing loss, ear pain, nasal congestion, sore throat Cardiovascular: No chest  pain, palpitations Respiratory: No shortness of breath at rest or with exertion.   No wheezes GastrointestinaI: No nausea, vomiting, diarrhea, abdominal pain, fecal incontinence Genitourinary: No dysuria, urinary retention or frequency.  No nocturia. Musculoskeletal: No neck pain, back pain Integumentary: No rash, pruritus, skin lesions Neurological: as above Psychiatric: No depression at this time.  No anxiety Endocrine: No palpitations, diaphoresis, change in appetite, change in weigh or increased thirst Hematologic/Lymphatic: No anemia, purpura, petechiae. Allergic/Immunologic: No itchy/runny eyes, nasal congestion, recent allergic reactions, rashes  ALLERGIES: No Known Allergies  HOME MEDICATIONS:  Current Outpatient Medications:  .  acetaminophen (TYLENOL) 325 MG tablet, Take 2 tablets (650 mg total) by mouth every 6 (six) hours as needed for mild pain (or Fever >/= 101)., Disp: , Rfl:  .  famotidine (PEPCID) 20 MG tablet, Take 1 tablet (20 mg total) by mouth daily for 7 days., Disp: 7 tablet, Rfl: 0 .  prenatal vitamin w/FE, FA (PRENATAL 1 + 1) 27-1 MG TABS tablet, Take 1 tablet by mouth daily at 12 noon., Disp: 30 each, Rfl: 0  PAST MEDICAL HISTORY: History reviewed. No pertinent past medical history.  PAST SURGICAL HISTORY: Past Surgical History:  Procedure Laterality Date  . CESAREAN SECTION N/A 11/27/2018   Procedure: CESAREAN SECTION;  Surgeon: Myna Hidalgo, DO;  Location: [facility] LD ORS;  Service: Obstetrics;  Laterality: N/A;    FAMILY HISTORY: Family History  Problem Relation Age of Onset  . Cancer Maternal Grandmother   . Heart disease Maternal Grandfather   . Cancer Paternal Grandfather   . Cancer Maternal Aunt   . Cancer Paternal Aunt     SOCIAL HISTORY:  Social History   Socioeconomic History  . Marital status: Single    Spouse name: Not on file  . Number of children: 1  . Years of education: Not on file  . Highest education level: Not on file   Occupational History  . Occupation: [employer]  Tobacco Use  . Smoking status: Never Smoker  . Smokeless tobacco: Never Used  Vaping Use  . Vaping Use: Never used  Substance and Sexual Activity  . Alcohol use: No    Alcohol/week: 0.0 standard drinks  . Drug use: No  . Sexual activity: Yes  Other Topics Concern  . Not on file  Social History Narrative   Lives with partner and son   Caffeine use: Coffee    Right handed   Social Determinants of Health   Financial Resource Strain:   . Difficulty of Paying Living Expenses: Not on file  Food Insecurity:   . Worried About Programme researcher, broadcasting/film/video in the Last Year: Not on file  . Ran Out of Food in the Last Year: Not on file  Transportation Needs:   . Lack of Transportation (Medical): Not on file  . Lack of Transportation (Non-Medical): Not on file  Physical Activity:   . Days of Exercise per Week: Not on file  . Minutes of Exercise  per Session: Not on file  Stress:   . Feeling of Stress : Not on file  Social Connections:   . Frequency of Communication with Friends and Family: Not on file  . Frequency of Social Gatherings with Friends and Family: Not on file  . Attends Religious Services: Not on file  . Active Member of Clubs or Organizations: Not on file  . Attends Banker Meetings: Not on file  . Marital Status: Not on file  Intimate Partner Violence:   . Fear of Current or Ex-Partner: Not on file  . Emotionally Abused: Not on file  . Physically Abused: Not on file  . Sexually Abused: Not on file     PHYSICAL EXAM  Vitals:   07/15/20 0833  BP: 122/78  Pulse: 82  SpO2: 98%  Weight: 149 lb (67.6 kg)  Height:  (1.575 m)    Body mass index is 27.25 kg/m.   Hearing Screening             Right ear:           Left ear:             Visual Acuity Screening   Right eye Left eye Both eyes  Without correction:     With correction: 20/20  20/20 20/20    General: The patient is well-developed and well-nourished and in no acute distress  HEENT:  Head is [place]/AT.  Sclera are anicteric.  Funduscopic exam shows normal optic discs and retinal vessels.  Neck: No carotid bruits are noted.  The neck is nontender.  Cardiovascular: The heart has a regular rate and rhythm with a normal S1 and S2. There were no murmurs, gallops or rubs.    Skin: Extremities are without rash or  edema.  Musculoskeletal:  Back is nontender  Neurologic Exam  Mental status: The patient is alert and oriented x 3 at the time of the examination. The patient has apparent normal recent and remote memory, with an apparently normal attention span and concentration ability.   Speech is normal.  Cranial nerves: Extraocular movements are full. Pupils are equal, round, and reactive to light and accomodation. Normal color vision. .  Facial symmetry is present. There is good facial sensation to soft touch bilaterally.Facial strength is normal.  Trapezius and sternocleidomastoid strength is normal. No dysarthria is noted.  The tongue is midline, and the patient has symmetric elevation of the soft palate. No obvious hearing deficits are noted.  Motor:  Muscle bulk is normal.   Tone is normal. Strength is  5 / 5 in all 4 extremities.   Sensory: Sensory testing is intact to pinprick, soft touch and vibration sensation in all 4 extremities.  Coordination: Cerebellar testing reveals good finger-nose-finger and heel-to-shin bilaterally.  Gait and station: Station is normal.   Gait is normal. Tandem gait is normal. Romberg is negative.   Reflexes: Deep tendon reflexes are symmetric and normal bilaterally.   Plantar responses are flexor.     DIAGNOSTIC DATA (LABS, IMAGING, TESTING) - I reviewed patient records, labs, notes, testing and imaging myself where available.  Lab Results  Component Value Date   WBC 11.7 (H) 07/08/2020   HGB 13.3 07/08/2020   HCT 40.3  07/08/2020   MCV 91.8 07/08/2020   PLT 303 07/08/2020      Component Value Date/Time   NA 139 07/10/2020 0444   K 4.3 07/10/2020 0444   CL 107 07/10/2020 0444   CO2 23 07/10/2020 0444  GLUCOSE 138 (H) 07/10/2020 0444   BUN 16 07/10/2020 0444   CREATININE 0.65 07/10/2020 0444   CALCIUM 8.6 (L) 07/10/2020 0444   PROT 7.8 07/06/2020 1731   ALBUMIN 4.5 07/06/2020 1731   AST 17 07/06/2020 1731   ALT 15 07/06/2020 1731   ALKPHOS 48 07/06/2020 1731   BILITOT 0.8 07/06/2020 1731   GFRNONAA >60 07/10/2020 0444   GFRAA >60 11/27/2018 1957   No results found for: CHOL, HDL, LDLCALC, LDLDIRECT, TRIG, CHOLHDL Lab Results  Component Value Date   HGBA1C 5.4 07/07/2020       ASSESSMENT AND PLAN  Multiple sclerosis (HCC)  Numbness - Plan: Vitamin B12  Vitamin D deficiency - Plan: VITAMIN D 25 Hydroxy (Vit-D Deficiency, Fractures)  Diplopia - Plan: Vitamin B12   In summary, Ms. Sandeen is a 29 year old woman recently diagnosed with MS after presenting with diplopia. Additionally, several months ago she had an episode of numbness associated with Lhermitte sign. MRI of the brain and spine are consistent with multiple sclerosis. She is currently about [redacted] weeks pregnant (will see OB/GYN soon). This does present some challenges in treatment of MS. I believe her level of aggressiveness is average to above average and I am concerned that she has multiple enhancing lesions on the current MRI and has multiple spinal cord lesions and had 2 exacerbations within 3 or 4 months. Therefore, I think treating her during pregnancy is reasonable. The safest medication to use is Copaxone/glatiramer and I will get her started on 40 mg injections 3 times a week.  If she continues to have multiple exacerbations, consider a switch to Tysabri (can be associated with hematologic abnormalities in newborns that are transient).  After delivery and breast-feeding (if she chooses), we could switch to something else if she  prefers the pill over a shot.   I would also check vitamin D and vitamin B12 as deficiencies might increase progression of neurologic symptoms.  We also discussed eating well. In general, a heart healthy diet is a brain healthy diet and is recommended. She should continue to stay active and exercise as tolerated.   We discussed possible symptoms of relapse and she should call if she is experiencing any of them. Additionally if she has difficulty with her treatment or other concerns she should let us know. Otherwise, I will see her back for regular scheduled visit in about 4 months.  Thank you for asking me to see Ms. Durwin Glaze. Please let me know if I can be of further assistance with her or other patients in the future.    Kitana Gage A. Epimenio Foot, MD, [facility] 07/15/2020, 9:18 AM Certified in Neurology, Clinical Neurophysiology, Sleep Medicine and Neuroimaging  [facility] [address] [place], [place] [address] [phone]

## 2020-07-16 ENCOUNTER — Other Ambulatory Visit: Payer: Self-pay | Admitting: Neurology

## 2020-07-16 LAB — VITAMIN D 25 HYDROXY (VIT D DEFICIENCY, FRACTURES): Vit D, 25-Hydroxy: 19.8 ng/mL — ABNORMAL LOW (ref 30.0–100.0)

## 2020-07-16 LAB — VITAMIN B12: Vitamin B-12: 417 pg/mL (ref 232–1245)

## 2020-07-16 MED ORDER — VITAMIN D (ERGOCALCIFEROL) 1.25 MG (50000 UNIT) PO CAPS: 50000.0000 [IU] | ORAL_CAPSULE | ORAL | 1 refills | Status: DC

## 2020-07-16 NOTE — Telephone Encounter (Signed)
Per CMM PA approved effective from 07/15/2020 through 07/14/2021.

## 2020-07-19 ENCOUNTER — Telehealth: Payer: Self-pay | Admitting: Neurology

## 2020-07-19 NOTE — Telephone Encounter (Signed)
Faxed enrollment form and PA approval info to [facility] at [phone]. Received fax confirmation.

## 2020-07-19 NOTE — Telephone Encounter (Signed)
[facility]ocate Rhina Brackett) called, did benefit investigation, specialty pharmacy will be [facility] for Glatiramer Acetate. PA already on file, Pt enrollment form can be faxed to [facility].

## 2020-07-26 NOTE — Telephone Encounter (Signed)
Tried calling pt, mailbox full, unable to LVM. Wanted to see if she has received her glatiramer from specialty pharmacy yet. If she calls, please get update. Thank you

## 2020-07-27 DIAGNOSIS — Z3201 Encounter for pregnancy test, result positive: Secondary | ICD-10-CM | POA: Diagnosis not present

## 2020-07-27 DIAGNOSIS — Z3480 Encounter for supervision of other normal pregnancy, unspecified trimester: Secondary | ICD-10-CM | POA: Diagnosis not present

## 2020-07-27 NOTE — Telephone Encounter (Signed)
Pt returned phone call, relayed telephone note. Pt stated, have not received glatiramer yet, suppose to be delivered Thursday.

## 2020-07-27 NOTE — Telephone Encounter (Signed)
I called pt again to discuss. No answer, left a message asking her to call me back. 

## 2020-07-28 DIAGNOSIS — Z3491 Encounter for supervision of normal pregnancy, unspecified, first trimester: Secondary | ICD-10-CM | POA: Diagnosis not present

## 2020-07-29 ENCOUNTER — Telehealth: Payer: Self-pay | Admitting: Neurology

## 2020-07-29 NOTE — Telephone Encounter (Signed)
The dose I placed her on (50,000 units weekly) is a standard dose for low vitamin D and the dose I have used for many other patients.  If she would prefer a lower dose, we can do 5000 units daily and she can get those over-the-counter

## 2020-07-29 NOTE — Telephone Encounter (Signed)
Called and left detailed message relaying Dr. Bonnita Hollow note. Advised she would not be on high dose long term. He would re-evaluate Vit D level after being on higher dose for awhile and would eventually have her change to OTC Vit D thereafter as long as Vit D level improves. Advised her to call back if she has any further questions/concerns.

## 2020-07-29 NOTE — Telephone Encounter (Signed)
Pt. states her Gynecologist wants Vitamin D, Ergocalciferol, (DRISDOL) 1.25 MG (50000 UNIT) CAPS capsule dosage to be changed. She states Dr. states it is too high.  Pharmacy: [facility] [phone]

## 2020-07-29 NOTE — Telephone Encounter (Signed)
Dr. Sater- please advise 

## 2020-07-29 NOTE — Telephone Encounter (Signed)
Noted, I updated pt medication list.

## 2020-07-29 NOTE — Telephone Encounter (Signed)
Pt. called back & was relayed message from RN & also that if she doesnt want to do high dose vit d prescription, dr sater said she can take over the counter 5000units per day instead. Pt. stated she is fine with this.

## 2020-07-29 NOTE — Addendum Note (Signed)
Addended by: Arther Abbott on: 07/29/2020 02:59 PM   Modules accepted: Orders

## 2020-08-03 NOTE — Telephone Encounter (Signed)
I called pt. No answer, left a message asking her to call me back.   It pt calls back please ask if she received her glatiramer shipment and tolerating injections well.

## 2020-08-03 NOTE — Telephone Encounter (Signed)
Pt returned phone call. Received Glatiramer last Thursday, started Friday. Yes I have tolerated injection well.

## 2020-08-12 DIAGNOSIS — Z3687 Encounter for antenatal screening for uncertain dates: Secondary | ICD-10-CM | POA: Diagnosis not present

## 2020-08-12 DIAGNOSIS — N926 Irregular menstruation, unspecified: Secondary | ICD-10-CM | POA: Diagnosis not present

## 2020-08-23 DIAGNOSIS — O2340 Unspecified infection of urinary tract in pregnancy, unspecified trimester: Secondary | ICD-10-CM | POA: Diagnosis not present

## 2020-09-06 ENCOUNTER — Telehealth: Payer: Self-pay | Admitting: Neurology

## 2020-09-06 DIAGNOSIS — O09213 Supervision of pregnancy with history of pre-term labor, third trimester: Secondary | ICD-10-CM | POA: Diagnosis not present

## 2020-09-06 NOTE — Telephone Encounter (Signed)
I think the lump she is getting may be due to patient's technique of injection not being ideal. Kindly advise her to watch the instructional video again and to rotate the sites of injections. Dr. Epimenio Foot can weigh in after his return on further advice

## 2020-09-06 NOTE — Telephone Encounter (Signed)
Called, LVM returning pt call. Asked she call back to further discuss. I reviewed her chart, looks like she started glatiramer acetate 07/30/20.

## 2020-09-06 NOTE — Telephone Encounter (Signed)
Took call from phone staff and spoke with pt. Confirmed she started glatiramer on 07/30/20. She notices lumps about 2 hr after injection and they typically last about 2 days. Reports itching/heat. Did have a red circle last Wednesday after her injection. Denies any other sx. She is due for next injection today at 9am but wanted to hold off until she could get further instruction from MD. I made her aware Dr. Epimenio Foot out this week and message will be sent to covering physician.

## 2020-09-06 NOTE — Telephone Encounter (Signed)
Pt called wanting to discuss the lumps she has been getting lately when she administers her Glatiramer Acetate 40 MG/ML SOSY Please advise.

## 2020-09-06 NOTE — Telephone Encounter (Signed)
Dr. Epimenio Foot- can you provide any recommendation?   Called pt back. She confirmed she did instructional video for injection training. She also is rotating sites. She is nervous to do another injection. Would like Dr. Bonnita Hollow input first. Advised he is off. I will send message to see if I can get recommendation. I will follow up tomorrow with her.

## 2020-09-06 NOTE — Telephone Encounter (Signed)
Lumps and/or red spots after injections are common -- usually they last 1-7 days.  If they are lasting much longer or are painful we would need to consider a different medication

## 2020-09-07 NOTE — Telephone Encounter (Signed)
Called pt and relayed Dr. Sater's message. She verbalized understanding and appreciation. 

## 2020-09-09 DIAGNOSIS — O09213 Supervision of pregnancy with history of pre-term labor, third trimester: Secondary | ICD-10-CM | POA: Diagnosis not present

## 2020-09-11 NOTE — L&D Delivery Note (Signed)
Operative Delivery Note  In to evaluate for cervical change and cervix was found to be complete and +1 station. Bed was broken down and patient draped. When patient was at +2 station, the fetal heart rate was noted to be in the 70s-90s for at least 6 minutes. There was rises in the heart rates to 130s but difficult to trace with fetal doppler.  Patient was consented to vacuum assisted delivery. Verbal consent: obtained from patient.  Risks and benefits discussed in detail.  Risks include, but are not limited to the risks of anesthesia, bleeding, infection, damage to maternal tissues, fetal cephalhematoma and, subgaleal hemorrhage.  There is also the risk of inability to effect vaginal delivery of the head, or shoulder dystocia that cannot be resolved by established maneuvers, leading to the need for emergency cesarean section.  NICU was called to attend delivery. Fetal head palpated and determined to be left occiput anterior. The Iiwi vacuum was applied at the flexion point of the fetal head. The suction was placed in the green zone and suction released in between pushes. There were two pop-offs. Discontinued use of vacuum after the second pop off. The patient continued to push until delivery.  At 2:19 PM a viable and healthy female was delivered via Vaginal, Spontaneous.  Presentation: LOA  The vagina, cervix, and perineum were inspected and a second degree laceration was identified and repaired in standard fashion. Adequate hemostasis was noted.  APGAR: 9, 9; weight: see infant's chart Placenta status: Intact, 3 vessels - sent to pathology Cord:  with the following complications: None .  Cord pH: Collected  Anesthesia:  Eipdural Instruments: None Episiotomy: None Lacerations: 2nd degree Suture Repair: 3.0  Vicryl Quantiative Blood Loss (mL): 340  The fetal head was inspected and it appeared the vacuum was applied in the proper location.  Mom to postpartum.  Baby to Couplet care / Skin to  Skin.  Steva Ready 02/16/2021, 3:48 PM

## 2020-09-12 DIAGNOSIS — O09213 Supervision of pregnancy with history of pre-term labor, third trimester: Secondary | ICD-10-CM | POA: Diagnosis not present

## 2020-09-15 DIAGNOSIS — O09213 Supervision of pregnancy with history of pre-term labor, third trimester: Secondary | ICD-10-CM | POA: Diagnosis not present

## 2020-09-24 DIAGNOSIS — R112 Nausea with vomiting, unspecified: Secondary | ICD-10-CM | POA: Diagnosis not present

## 2020-09-24 DIAGNOSIS — J029 Acute pharyngitis, unspecified: Secondary | ICD-10-CM | POA: Diagnosis not present

## 2020-09-24 DIAGNOSIS — Z1152 Encounter for screening for COVID-19: Secondary | ICD-10-CM | POA: Diagnosis not present

## 2020-09-24 DIAGNOSIS — Z03818 Encounter for observation for suspected exposure to other biological agents ruled out: Secondary | ICD-10-CM | POA: Diagnosis not present

## 2020-09-30 DIAGNOSIS — Z36 Encounter for antenatal screening for chromosomal anomalies: Secondary | ICD-10-CM | POA: Diagnosis not present

## 2020-09-30 DIAGNOSIS — Z3490 Encounter for supervision of normal pregnancy, unspecified, unspecified trimester: Secondary | ICD-10-CM | POA: Diagnosis not present

## 2020-10-07 DIAGNOSIS — O09213 Supervision of pregnancy with history of pre-term labor, third trimester: Secondary | ICD-10-CM | POA: Diagnosis not present

## 2020-10-10 DIAGNOSIS — O09213 Supervision of pregnancy with history of pre-term labor, third trimester: Secondary | ICD-10-CM | POA: Diagnosis not present

## 2020-10-13 DIAGNOSIS — O09213 Supervision of pregnancy with history of pre-term labor, third trimester: Secondary | ICD-10-CM | POA: Diagnosis not present

## 2020-10-16 DIAGNOSIS — O09213 Supervision of pregnancy with history of pre-term labor, third trimester: Secondary | ICD-10-CM | POA: Diagnosis not present

## 2020-11-10 DIAGNOSIS — O09213 Supervision of pregnancy with history of pre-term labor, third trimester: Secondary | ICD-10-CM | POA: Diagnosis not present

## 2020-11-13 DIAGNOSIS — O09213 Supervision of pregnancy with history of pre-term labor, third trimester: Secondary | ICD-10-CM | POA: Diagnosis not present

## 2020-11-16 DIAGNOSIS — O09213 Supervision of pregnancy with history of pre-term labor, third trimester: Secondary | ICD-10-CM | POA: Diagnosis not present

## 2020-11-19 DIAGNOSIS — O09213 Supervision of pregnancy with history of pre-term labor, third trimester: Secondary | ICD-10-CM | POA: Diagnosis not present

## 2020-11-29 DIAGNOSIS — Z23 Encounter for immunization: Secondary | ICD-10-CM | POA: Diagnosis not present

## 2020-11-29 DIAGNOSIS — Z3483 Encounter for supervision of other normal pregnancy, third trimester: Secondary | ICD-10-CM | POA: Diagnosis not present

## 2020-11-29 DIAGNOSIS — Z3490 Encounter for supervision of normal pregnancy, unspecified, unspecified trimester: Secondary | ICD-10-CM | POA: Diagnosis not present

## 2020-12-02 ENCOUNTER — Ambulatory Visit (INDEPENDENT_AMBULATORY_CARE_PROVIDER_SITE_OTHER): Payer: BC Managed Care – PPO | Admitting: Neurology

## 2020-12-02 ENCOUNTER — Encounter: Payer: Self-pay | Admitting: Neurology

## 2020-12-02 ENCOUNTER — Other Ambulatory Visit: Payer: Self-pay

## 2020-12-02 VITALS — BP 124/65 | HR 85 | Ht 62.0 in | Wt 156.5 lb

## 2020-12-02 DIAGNOSIS — G35 Multiple sclerosis: Secondary | ICD-10-CM | POA: Diagnosis not present

## 2020-12-02 DIAGNOSIS — R200 Anesthesia of skin: Secondary | ICD-10-CM | POA: Diagnosis not present

## 2020-12-02 DIAGNOSIS — H532 Diplopia: Secondary | ICD-10-CM

## 2020-12-02 DIAGNOSIS — Z3490 Encounter for supervision of normal pregnancy, unspecified, unspecified trimester: Secondary | ICD-10-CM

## 2020-12-02 NOTE — Progress Notes (Signed)
[facility]  PATIENT: Natalie Zimmerman DOB: 18-May-1991  REFERRING DOCTOR OR PCP: Ramiro Harvest, MD SOURCE: Patient, notes from recent hospitalization, imaging and lab reports, MRI images personally reviewed.  _________________________________   HISTORICAL  CHIEF COMPLAINT:  Chief Complaint  Patient presents with  . Follow-up    RM 12, alone. Last seen 07/15/20. On Glatiramer for MS. Due in June w/ baby. Sex of baby they are keeping a surprise. She is doing well, no new sx. She still has numb.ting at night.     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  Update 12/02/2020: She was diagnosed with MS last year well about [redacted] weeks pregnant.  Treatment options were limited and she started Copaxone.  She is tolerating Copaxone fairly well but has some skin reactions.  She notes no difficulty with gait, balance or strength.    She has uncomfortable tingling at times in her hands and feet, more noticeable at night.    Her vision is fine.   She has no diplopia, acuity loss or color desaturations.     Bladder function is fine.    She was having sleep issues but they are better now and she feels she is doing well.   She has some fatigue but is 7 months pregnant and has a 31 year old.   She is planning a full year of breastfeeding  MS history: She had the onset of blurry vision when she looked to her left July 01, 2020.  This worsened over the next two days, over the weekend, especially looking at headlights while driving.   She saw ophthalmology 07/06/2020 and they suspected a neurologic disorder.   She was referred to the [facility] ED 07/06/2020.   MRIs of the brain and spine showed multiple lesions consistent with MS. Some of the foci enhanced after contrast consistent with more acute lesions in confirming the diagnosis. She was admitted and had 5 days of IV SOlu-Medrol.   Symptoms improved and she was discharged home.   While in the  hospital, it was discovered that she was about 6 to [redacted] weeks pregnant.     Besides diplopia, she had no other symptoms at the time.   However, in the past, she did note some numbness or a hot sensation in her feet but never > 1 day.   However, she had intermittent Lhermitte signs since July 2021.     She denies current numbness, weakness or ataxia.   Gait is fine and stairs are not a problem without holding the bannister.  Bladder function is fine.   She has no FH of MS or autoimmune disease.     Imaging: MRI of the brain and orbits 07/06/2020 showed multiple T2/FLAIR hyperintense foci predominantly in the periventricular, juxtacortical and deep white matter of the hemispheres. Many of the periventricular foci are radially oriented to the ventricles.  At least 13 foci enhanced after contrast was administered.  The enhancing foci were very small and would not be expected to be symptomatic.Marland Kitchen There were just a couple small nonenhancing nonenhancing foci in the brainstem and cerebellum, none in a position that would be expected to cause diplopia. Optic nerves were normal.  MRI of the cervical and thoracic spine performed 07/07/2020 showed T2 hyperintense foci posteriorly at C2-C3 and towards the left at C3-C4, anteriorly at T1-T2, to the left at T3, to the right at T3-T4, centrally at T5, to the anterolateral right at  T6 and centrally at T11-T12. Due to her pregnancy, the study was done without contrast.  REVIEW OF SYSTEMS: Constitutional: No fevers, chills, sweats, or change in appetite Eyes: No visual changes, double vision, eye pain Ear, nose and throat: No hearing loss, ear pain, nasal congestion, sore throat Cardiovascular: No chest pain, palpitations Respiratory: No shortness of breath at rest or with exertion.   No wheezes GastrointestinaI: No nausea, vomiting, diarrhea, abdominal pain, fecal incontinence Genitourinary: No dysuria, urinary retention or frequency.  No nocturia. Musculoskeletal:  No neck pain, back pain Integumentary: No rash, pruritus, skin lesions Neurological: as above Psychiatric: No depression at this time.  No anxiety Endocrine: No palpitations, diaphoresis, change in appetite, change in weigh or increased thirst Hematologic/Lymphatic: No anemia, purpura, petechiae. Allergic/Immunologic: No itchy/runny eyes, nasal congestion, recent allergic reactions, rashes  ALLERGIES: No Known Allergies  HOME MEDICATIONS:  Current Outpatient Medications:  .  acetaminophen (TYLENOL) 325 MG tablet, Take 2 tablets (650 mg total) by mouth every 6 (six) hours as needed for mild pain (or Fever >/= 101)., Disp: , Rfl:  .  Ferrous Sulfate (IRON PO), Take by mouth., Disp: , Rfl:  .  Glatiramer Acetate 40 MG/ML SOSY, Inject into the skin., Disp: , Rfl:  .  prenatal vitamin w/FE, FA (PRENATAL 1 + 1) 27-1 MG TABS tablet, Take 1 tablet by mouth daily at 12 noon., Disp: 30 each, Rfl: 0 .  VITAMIN D PO, Take 5,000 Units by mouth daily., Disp: , Rfl:   PAST MEDICAL HISTORY: History reviewed. No pertinent past medical history.  PAST SURGICAL HISTORY: Past Surgical History:  Procedure Laterality Date  . CESAREAN SECTION N/A 11/27/2018   Procedure: CESAREAN SECTION;  Surgeon: Myna Hidalgo, DO;  Location: [facility]S;  Service: Obstetrics;  Laterality: N/A;    FAMILY HISTORY: Family History  Problem Relation Age of Onset  . Cancer Maternal Grandmother   . Heart disease Maternal Grandfather   . Cancer Paternal Grandfather   . Cancer Maternal Aunt   . Cancer Paternal Aunt     SOCIAL HISTORY:  Social History   Socioeconomic History  . Marital status: Single    Spouse name: Not on file  . Number of children: 1  . Years of education: Not on file  . Highest education level: Not on file  Occupational History  . Occupation: [employer]  Tobacco Use  . Smoking status: Never Smoker  . Smokeless tobacco: Never Used  Vaping Use  . Vaping Use: Never used  Substance  and Sexual Activity  . Alcohol use: No    Alcohol/week: 0.0 standard drinks  . Drug use: No  . Sexual activity: Yes  Other Topics Concern  . Not on file  Social History Narrative   Lives with partner and son   Caffeine use: Coffee    Right handed   Social Determinants of Health   Financial Resource Strain: Not on file  Food Insecurity: Not on file  Transportation Needs: Not on file  Physical Activity: Not on file  Stress: Not on file  Social Connections: Not on file  Intimate Partner Violence: Not on file     PHYSICAL EXAM  Vitals:   12/02/20 0933  BP: 124/65  Pulse: 85  Weight: 156 lb 8 oz (71 kg)  Height: 5\' 2"  (1.575 m)    Body mass index is 28.62 kg/m.  General: The patient is well-developed and well-nourished and in no acute distress  HEENT:  Head is [place]/AT.  Sclera are anicteric.  Skin: Extremities are without rash or  edema.  Neurologic Exam  Mental status: The patient is alert and oriented x 3 at the time of the examination. The patient has apparent normal recent and remote memory, with an apparently normal attention span and concentration ability.   Speech is normal.  Cranial nerves: Extraocular movements are full.  Facial strength and sensation was normal.  Hearing was normal. Motor:  Muscle bulk is normal.   Tone is normal. Strength is  5 / 5 in all 4 extremities.   Sensory: Sensory testing is intact to pinprick, soft touch and vibration sensation in all 4 extremities.  Coordination: Cerebellar testing reveals good finger-nose-finger and heel-to-shin bilaterally.  Gait and station: Station is normal.   Gait is normal. Tandem gait is normal. Romberg is negative.   Reflexes: Deep tendon reflexes are symmetric and normal bilaterally.   Plantar responses are flexor.     DIAGNOSTIC DATA (LABS, IMAGING, TESTING) - I reviewed patient records, labs, notes, testing and imaging myself where available.  Lab Results  Component Value Date   WBC 11.7 (H)  07/08/2020   HGB 13.3 07/08/2020   HCT 40.3 07/08/2020   MCV 91.8 07/08/2020   PLT 303 07/08/2020      Component Value Date/Time   NA 139 07/10/2020 0444   K 4.3 07/10/2020 0444   CL 107 07/10/2020 0444   CO2 23 07/10/2020 0444   GLUCOSE 138 (H) 07/10/2020 0444   BUN 16 07/10/2020 0444   CREATININE 0.65 07/10/2020 0444   CALCIUM 8.6 (L) 07/10/2020 0444   PROT 7.8 07/06/2020 1731   ALBUMIN 4.5 07/06/2020 1731   AST 17 07/06/2020 1731   ALT 15 07/06/2020 1731   ALKPHOS 48 07/06/2020 1731   BILITOT 0.8 07/06/2020 1731   GFRNONAA >60 07/10/2020 0444   GFRAA >60 11/27/2018 1957   No results found for: CHOL, HDL, LDLCALC, LDLDIRECT, TRIG, CHOLHDL Lab Results  Component Value Date   HGBA1C 5.4 07/07/2020       ASSESSMENT AND PLAN  Multiple sclerosis (HCC)  Diplopia  Numbness  Pregnancy, unspecified gestational age  50.   Continue Copaxone.  If she continues to have skin reactions, I would consider a different disease modifying therapy after she completes breast-feeding.  If she has breakthrough activity after delivery we would need to step up the efficacy, I would recommend Ocrevus or Tysabri. 2.   Stay active exercise as tolerated. 3.   Return in 6 months or sooner for new or worsening neurologic symptoms.    Khamarion Bjelland A. Epimenio Foot, MD, [facility] 12/02/2020, 6:12 PM Certified in Neurology, Clinical Neurophysiology, Sleep Medicine and Neuroimaging  [facility] [address], [address] [place], [place] [address] [phone]

## 2020-12-09 DIAGNOSIS — O09213 Supervision of pregnancy with history of pre-term labor, third trimester: Secondary | ICD-10-CM | POA: Diagnosis not present

## 2020-12-12 DIAGNOSIS — O09213 Supervision of pregnancy with history of pre-term labor, third trimester: Secondary | ICD-10-CM | POA: Diagnosis not present

## 2020-12-15 DIAGNOSIS — O09213 Supervision of pregnancy with history of pre-term labor, third trimester: Secondary | ICD-10-CM | POA: Diagnosis not present

## 2020-12-18 DIAGNOSIS — O09213 Supervision of pregnancy with history of pre-term labor, third trimester: Secondary | ICD-10-CM | POA: Diagnosis not present

## 2020-12-29 DIAGNOSIS — J00 Acute nasopharyngitis [common cold]: Secondary | ICD-10-CM | POA: Diagnosis not present

## 2021-01-12 DIAGNOSIS — O26843 Uterine size-date discrepancy, third trimester: Secondary | ICD-10-CM | POA: Diagnosis not present

## 2021-01-12 DIAGNOSIS — O09213 Supervision of pregnancy with history of pre-term labor, third trimester: Secondary | ICD-10-CM | POA: Diagnosis not present

## 2021-01-12 DIAGNOSIS — Z3A33 33 weeks gestation of pregnancy: Secondary | ICD-10-CM | POA: Diagnosis not present

## 2021-01-15 DIAGNOSIS — O09213 Supervision of pregnancy with history of pre-term labor, third trimester: Secondary | ICD-10-CM | POA: Diagnosis not present

## 2021-01-18 DIAGNOSIS — O09213 Supervision of pregnancy with history of pre-term labor, third trimester: Secondary | ICD-10-CM | POA: Diagnosis not present

## 2021-01-21 DIAGNOSIS — O09213 Supervision of pregnancy with history of pre-term labor, third trimester: Secondary | ICD-10-CM | POA: Diagnosis not present

## 2021-01-27 DIAGNOSIS — Z3490 Encounter for supervision of normal pregnancy, unspecified, unspecified trimester: Secondary | ICD-10-CM | POA: Diagnosis not present

## 2021-01-27 DIAGNOSIS — Z3483 Encounter for supervision of other normal pregnancy, third trimester: Secondary | ICD-10-CM | POA: Diagnosis not present

## 2021-01-27 LAB — OB RESULTS CONSOLE GBS: GBS: NEGATIVE

## 2021-02-16 ENCOUNTER — Other Ambulatory Visit: Payer: Self-pay

## 2021-02-16 ENCOUNTER — Encounter (HOSPITAL_COMMUNITY): Payer: Self-pay | Admitting: Obstetrics and Gynecology

## 2021-02-16 ENCOUNTER — Inpatient Hospital Stay (HOSPITAL_COMMUNITY): Payer: BC Managed Care – PPO | Admitting: Anesthesiology

## 2021-02-16 ENCOUNTER — Inpatient Hospital Stay (HOSPITAL_COMMUNITY)
Admission: AD | Admit: 2021-02-16 | Discharge: 2021-02-17 | DRG: 807 | Disposition: A | Discharge status: Home / Self Care | Payer: BC Managed Care – PPO | Attending: Obstetrics and Gynecology | Admitting: Obstetrics and Gynecology

## 2021-02-16 DIAGNOSIS — O26893 Other specified pregnancy related conditions, third trimester: Secondary | ICD-10-CM | POA: Diagnosis not present

## 2021-02-16 DIAGNOSIS — Z3A38 38 weeks gestation of pregnancy: Secondary | ICD-10-CM

## 2021-02-16 DIAGNOSIS — O43813 Placental infarction, third trimester: Secondary | ICD-10-CM | POA: Diagnosis not present

## 2021-02-16 DIAGNOSIS — O99354 Diseases of the nervous system complicating childbirth: Principal | ICD-10-CM | POA: Diagnosis present

## 2021-02-16 DIAGNOSIS — E282 Polycystic ovarian syndrome: Secondary | ICD-10-CM | POA: Diagnosis not present

## 2021-02-16 DIAGNOSIS — Z23 Encounter for immunization: Secondary | ICD-10-CM | POA: Diagnosis not present

## 2021-02-16 DIAGNOSIS — O34219 Maternal care for unspecified type scar from previous cesarean delivery: Secondary | ICD-10-CM | POA: Diagnosis not present

## 2021-02-16 DIAGNOSIS — G35 Multiple sclerosis: Secondary | ICD-10-CM | POA: Diagnosis present

## 2021-02-16 DIAGNOSIS — Z20822 Contact with and (suspected) exposure to covid-19: Secondary | ICD-10-CM | POA: Diagnosis present

## 2021-02-16 DIAGNOSIS — Z412 Encounter for routine and ritual male circumcision: Secondary | ICD-10-CM | POA: Diagnosis not present

## 2021-02-16 DIAGNOSIS — O99284 Endocrine, nutritional and metabolic diseases complicating childbirth: Secondary | ICD-10-CM | POA: Diagnosis not present

## 2021-02-16 HISTORY — DX: Other specified health status: Z78.9

## 2021-02-16 HISTORY — DX: Polycystic ovarian syndrome: E28.2

## 2021-02-16 LAB — TYPE AND SCREEN
ABO/RH(D): O POS
Antibody Screen: NEGATIVE

## 2021-02-16 LAB — CBC
HCT: 33 % — ABNORMAL LOW (ref 36.0–46.0)
Hemoglobin: 10.7 g/dL — ABNORMAL LOW (ref 12.0–15.0)
MCH: 28.7 pg (ref 26.0–34.0)
MCHC: 32.4 g/dL (ref 30.0–36.0)
MCV: 88.5 fL (ref 80.0–100.0)
Platelets: 285 10*3/uL (ref 150–400)
RBC: 3.73 MIL/uL — ABNORMAL LOW (ref 3.87–5.11)
RDW: 13.2 % (ref 11.5–15.5)
WBC: 7.8 10*3/uL (ref 4.0–10.5)
nRBC: 0 % (ref 0.0–0.2)

## 2021-02-16 LAB — RESP PANEL BY RT-PCR (FLU A&B, COVID) ARPGX2
Influenza A by PCR: NEGATIVE
Influenza B by PCR: NEGATIVE
SARS Coronavirus 2 by RT PCR: NEGATIVE

## 2021-02-16 LAB — RPR: RPR Ser Ql: NONREACTIVE

## 2021-02-16 MED ORDER — IBUPROFEN 800 MG PO TABS
800.0000 mg | ORAL_TABLET | Freq: Three times a day (TID) | ORAL | Status: DC
  Administered 2021-02-17 (×2): 800 mg via ORAL
  Filled 2021-02-16 (×3): qty 1

## 2021-02-16 MED ORDER — SENNOSIDES-DOCUSATE SODIUM 8.6-50 MG PO TABS
2.0000 | ORAL_TABLET | Freq: Every day | ORAL | Status: DC
  Administered 2021-02-17: 2 via ORAL
  Filled 2021-02-16: qty 2

## 2021-02-16 MED ORDER — OXYTOCIN-SODIUM CHLORIDE 30-0.9 UT/500ML-% IV SOLN
2.5000 [IU]/h | INTRAVENOUS | Status: DC
  Filled 2021-02-16: qty 500

## 2021-02-16 MED ORDER — WITCH HAZEL-GLYCERIN EX PADS: 1.0000 "application " | MEDICATED_PAD | CUTANEOUS | Status: DC | PRN

## 2021-02-16 MED ORDER — ZOLPIDEM TARTRATE 5 MG PO TABS: 5.0000 mg | ORAL_TABLET | Freq: Every evening | ORAL | Status: DC | PRN

## 2021-02-16 MED ORDER — ONDANSETRON HCL 4 MG/2ML IJ SOLN: 4.0000 mg | INTRAMUSCULAR | Status: DC | PRN

## 2021-02-16 MED ORDER — FLEET ENEMA 7-19 GM/118ML RE ENEM: 1.0000 | ENEMA | RECTAL | Status: DC | PRN

## 2021-02-16 MED ORDER — PRENATAL MULTIVITAMIN CH
1.0000 | ORAL_TABLET | Freq: Every day | ORAL | Status: DC
  Administered 2021-02-17: 1 via ORAL
  Filled 2021-02-16: qty 1

## 2021-02-16 MED ORDER — LACTATED RINGERS IV SOLN: 500.0000 mL | INTRAVENOUS | Status: DC | PRN

## 2021-02-16 MED ORDER — LIDOCAINE HCL (PF) 1 % IJ SOLN
INTRAMUSCULAR | Status: DC | PRN
  Administered 2021-02-16: 10 mL via EPIDURAL

## 2021-02-16 MED ORDER — DIBUCAINE (PERIANAL) 1 % EX OINT: 1.0000 "application " | TOPICAL_OINTMENT | CUTANEOUS | Status: DC | PRN

## 2021-02-16 MED ORDER — LIDOCAINE HCL (PF) 1 % IJ SOLN: 30.0000 mL | INTRAMUSCULAR | Status: DC | PRN

## 2021-02-16 MED ORDER — OXYTOCIN BOLUS FROM INFUSION
333.0000 mL | Freq: Once | INTRAVENOUS | Status: AC
  Administered 2021-02-16: 14:00:00 333 mL via INTRAVENOUS

## 2021-02-16 MED ORDER — LACTATED RINGERS IV SOLN: 500.0000 mL | Freq: Once | INTRAVENOUS | Status: DC

## 2021-02-16 MED ORDER — SIMETHICONE 80 MG PO CHEW: 80.0000 mg | CHEWABLE_TABLET | ORAL | Status: DC | PRN

## 2021-02-16 MED ORDER — FENTANYL CITRATE (PF) 100 MCG/2ML IJ SOLN
50.0000 ug | INTRAMUSCULAR | Status: DC | PRN
  Filled 2021-02-16: qty 2

## 2021-02-16 MED ORDER — COCONUT OIL OIL: 1.0000 "application " | TOPICAL_OIL | Status: DC | PRN

## 2021-02-16 MED ORDER — EPHEDRINE 5 MG/ML INJ: 10.0000 mg | INTRAVENOUS | Status: DC | PRN

## 2021-02-16 MED ORDER — DIPHENHYDRAMINE HCL 25 MG PO CAPS: 25.0000 mg | ORAL_CAPSULE | Freq: Four times a day (QID) | ORAL | Status: DC | PRN

## 2021-02-16 MED ORDER — OXYCODONE-ACETAMINOPHEN 5-325 MG PO TABS: 1.0000 | ORAL_TABLET | ORAL | Status: DC | PRN

## 2021-02-16 MED ORDER — OXYCODONE-ACETAMINOPHEN 5-325 MG PO TABS: 2.0000 | ORAL_TABLET | ORAL | Status: DC | PRN

## 2021-02-16 MED ORDER — ACETAMINOPHEN 325 MG PO TABS: 650.0000 mg | ORAL_TABLET | ORAL | Status: DC | PRN

## 2021-02-16 MED ORDER — BENZOCAINE-MENTHOL 20-0.5 % EX AERO
1.0000 "application " | INHALATION_SPRAY | CUTANEOUS | Status: DC | PRN
  Administered 2021-02-16: 1 via TOPICAL
  Filled 2021-02-16: qty 56

## 2021-02-16 MED ORDER — PHENYLEPHRINE 40 MCG/ML (10ML) SYRINGE FOR IV PUSH (FOR BLOOD PRESSURE SUPPORT): 80.0000 ug | PREFILLED_SYRINGE | INTRAVENOUS | Status: DC | PRN

## 2021-02-16 MED ORDER — DIPHENHYDRAMINE HCL 50 MG/ML IJ SOLN: 12.5000 mg | INTRAMUSCULAR | Status: DC | PRN

## 2021-02-16 MED ORDER — ONDANSETRON HCL 4 MG/2ML IJ SOLN: 4.0000 mg | Freq: Four times a day (QID) | INTRAMUSCULAR | Status: DC | PRN

## 2021-02-16 MED ORDER — LACTATED RINGERS IV SOLN: INTRAVENOUS | Status: DC

## 2021-02-16 MED ORDER — ACETAMINOPHEN 325 MG PO TABS
650.0000 mg | ORAL_TABLET | ORAL | Status: DC | PRN
  Administered 2021-02-16 – 2021-02-17 (×2): 650 mg via ORAL
  Filled 2021-02-16 (×2): qty 2

## 2021-02-16 MED ORDER — SOD CITRATE-CITRIC ACID 500-334 MG/5ML PO SOLN: 30.0000 mL | ORAL | Status: DC | PRN

## 2021-02-16 MED ORDER — FENTANYL-BUPIVACAINE-NACL 0.5-0.125-0.9 MG/250ML-% EP SOLN
12.0000 mL/h | EPIDURAL | Status: DC | PRN
Start: 2021-02-16 — End: 2021-02-16
  Administered 2021-02-16: 12 mL/h via EPIDURAL
  Filled 2021-02-16: qty 250

## 2021-02-16 MED ORDER — ONDANSETRON HCL 4 MG PO TABS: 4.0000 mg | ORAL_TABLET | ORAL | Status: DC | PRN

## 2021-02-16 NOTE — Lactation Note (Signed)
This note was copied from a baby's chart. Lactation Consultation Note  Patient Name: Natalie Zimmerman [id number] Date: 02/16/2021 Reason for consult: Initial assessment;Mother's request;Early term 37-38.6wks;Maternal endocrine disorder Age: 31 hrs  Mom states infant latched in L and D for 15 minutes. On arrival, infant resting quietly. Mom PCOS and MS on Glatiramer Acetate ( L3 Med [place] Slider with high MW/hyrdrophillic)  Mom noted breast changes increase in size but no leaking during pregnancy. Given hx of PCOS and low milk supply after 4 months, LC set her up on DEBP.   Plan 1. To feed based on cues 8-12x in 24 hr period, no more than 3 hrs without an attempt.          2. Mom to offer both breasts and look for signs of milk transfer.         3. If infant unable to latch, Mom to offer EBM via spoon or curve tip syringe.          4. Pumping DEBP q 3 hrs for         5 I and O sheet reviewed          6. [facility] brochure provided.  Mom to call for assistance with latching.  All questions answered at the end of the visit.   Maternal Data Has patient been taught Hand Expression?: Yes Does the patient have breastfeeding experience prior to this delivery?: Yes How long did the patient breastfeed?: first 4 months, low milk supply  Feeding Mother's Current Feeding Choice: Breast Milk  LATCH Score                    Lactation Tools Discussed/Used Tools: Pump;Flanges Flange Size: 24 Breast pump type: Double-Electric Breast Pump Pump Education: Setup, frequency, and cleaning;Milk Storage Reason for Pumping: increase stimulation Pumping frequency: every 3 hrs for 15 minutes  Interventions Interventions: Breast feeding basics reviewed;Breast compression;Skin to skin;DEBP;Position options;Breast massage;Hand express;Expressed milk;Education;Pre-pump if needed  Discharge Pump: Personal WIC Program: No  Consult Status Consult Status: Follow-up Date: 02/17/21 Follow-up type:  In-patient    Natalie Zimmerman  Natalie Zimmerman 02/16/2021, 5:49 PM

## 2021-02-16 NOTE — Progress Notes (Signed)
OB Progress Note  S: Patient breathing through contractions calmly. Does not desire epidural. Reviewed risks/benefits to AROM - patient consents to this as well as IUPC placement.  O: BP 129/80   Pulse 69   Temp 98.2 F (36.8 C) (Oral)   Resp 16   Ht 5\' 2"  (1.575 m)   Wt 75.8 kg   SpO2 98%   BMI 30.54 kg/m   FHT: 130bpm, moderate variablity, + accels, - decels Toco: q5-7 minutes SVE: 6-7/100/-2, sutures palpated AROM: Clear, non-odorous fluid, IUPC placed  A/P: 30 y.o. 26 @ [redacted]w[redacted]d admitted for labor/TOLAC.  FWB: Cat. I Labor course: Progressing normally and naturally Pain: Per patient request GBS: Negative Anticipate VBAC  [redacted]w[redacted]d, DO

## 2021-02-16 NOTE — Anesthesia Preprocedure Evaluation (Signed)
Anesthesia Evaluation  Patient identified by MRN, date of birth, ID band Patient awake    Reviewed: Allergy & Precautions, H&P , NPO status , Patient's Chart, lab work & pertinent test results  History of Anesthesia Complications Negative for: history of anesthetic complications  Airway Mallampati: II  TM Distance: >3 FB Neck ROM: full    Dental no notable dental hx.    Pulmonary neg pulmonary ROS,    Pulmonary exam normal        Cardiovascular negative cardio ROS Normal cardiovascular exam Rhythm:regular Rate:Normal     Neuro/Psych MS negative psych ROS   GI/Hepatic negative GI ROS, Neg liver ROS,   Endo/Other  PCOS  Renal/GU negative Renal ROS  negative genitourinary   Musculoskeletal   Abdominal   Peds  Hematology negative hematology ROS (+)   Anesthesia Other Findings   Reproductive/Obstetrics (+) Pregnancy                             Anesthesia Physical Anesthesia Plan  ASA: II  Anesthesia Plan: Epidural   Post-op Pain Management:    Induction:   PONV Risk Score and Plan:   Airway Management Planned:   Additional Equipment:   Intra-op Plan:   Post-operative Plan:   Informed Consent: I have reviewed the patients History and Physical, chart, labs and discussed the procedure including the risks, benefits and alternatives for the proposed anesthesia with the patient or authorized representative who has indicated his/her understanding and acceptance.       Plan Discussed with:   Anesthesia Plan Comments:         Anesthesia Quick Evaluation

## 2021-02-16 NOTE — Anesthesia Postprocedure Evaluation (Signed)
Anesthesia Post Note  Patient: Natalie Zimmerman  Procedure(s) Performed: AN AD HOC LABOR EPIDURAL     Patient location during evaluation: Mother Baby Anesthesia Type: Epidural Level of consciousness: awake and alert Pain management: pain level controlled Vital Signs Assessment: post-procedure vital signs reviewed and stable Respiratory status: spontaneous breathing, nonlabored ventilation and respiratory function stable Cardiovascular status: stable Postop Assessment: no headache, no backache and epidural receding Anesthetic complications: no   No complications documented.  Last Vitals:  Vitals:   02/16/21 1615 02/16/21 1805  BP: 127/83 118/68  Pulse: 92 73  Resp: 15 18  Temp: 36.6 C 36.8 C  SpO2: 98%     Last Pain:  Vitals:   02/16/21 2040  TempSrc:   PainSc: 4    Pain Goal:                Epidural/Spinal Function Cutaneous sensation: Normal sensation (02/16/21 2040), Patient able to flex knees: Yes (02/16/21 2040), Patient able to lift hips off bed: Yes (02/16/21 2040), Back pain beyond tenderness at insertion site: No (02/16/21 2040), Progressively worsening motor and/or sensory loss: No (02/16/21 2040), Bowel and/or bladder incontinence post epidural: No (02/16/21 2040)  [facility]ica Records

## 2021-02-16 NOTE — MAU Note (Signed)
Pt presents to MAU c/o ctx increasing in intensity beginning around 9pm this evening.  Pt endorses +fm, does not think her water broke.  Denies complications with this pregnancy, planning a TOLAC per pt.

## 2021-02-16 NOTE — Anesthesia Procedure Notes (Signed)
Epidural Patient location during procedure: OB Start time: 02/16/2021 10:46 AM End time: 02/16/2021 11:01 AM  Staffing Anesthesiologist: Lucretia Kern, MD Performed: anesthesiologist   Preanesthetic Checklist Completed: patient identified, IV checked, risks and benefits discussed, monitors and equipment checked, pre-op evaluation and timeout performed  Epidural Patient position: sitting Prep: DuraPrep Patient monitoring: heart rate, continuous pulse ox and blood pressure Approach: midline Location: L3-L4 Injection technique: LOR air  Needle:  Needle type: Tuohy  Needle gauge: 17 G Needle length: 9 cm Needle insertion depth: 5 cm Catheter type: closed end flexible Catheter size: 19 Gauge Catheter at skin depth: 10 cm Test dose: negative  Assessment Events: blood not aspirated, injection not painful, no injection resistance, no paresthesia and negative IV test  Additional Notes Reason for block:procedure for pain

## 2021-02-16 NOTE — H&P (Signed)
OB ADMISSION/ HISTORY & PHYSICAL:  Admission Date: 02/16/2021 12:31 AM  Admit Diagnosis: Normal labor  Natalie Zimmerman is a 30 y.o. female G52P0101 [redacted]w[redacted]d presenting for labor eval. Endorses active FM, denies LOF and vaginal bleeding. Ctx began @ 2200. Prev C/S @ 33 wks for preterm labor with breech presentation. Desires a trial of labor and has been consented in the office by Dr. Connye Burkitt. Used 17P this pregnancy since 09/30/20. Dx w/ multiple sclerosis in Oct 2022 and managed on Glatiramer Acetate. Growth followed for suspected size less than dates. 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   History of current pregnancy: G2P0101   Primary OB Provider: Dr. Connye Burkitt Patient entered care with [facility] at 9+2 wks.   EDC by [place] in first trimester D/T irreg periods Anatomy scan:  18+3 wks, complete w/ posterior placenta.    Significant prenatal events:  Patient Active Problem List   Diagnosis Date Noted  . Indication for care in labor or delivery 02/16/2021  . Multiple sclerosis (HCC) 07/08/2020  . Diplopia 07/06/2020  . Demyelinating changes in brain (HCC) 07/06/2020    Prenatal Labs: ABO, Rh: --/--/O POS (06/08 0209) Antibody: NEG (06/08 0209) Rubella:   immune RPR:   NR HBsAg:   NR HIV: Non Reactive (10/27 0432)  GTT: passed GBS: Negative/-- (05/19 0000)  GC/CHL: neg/neg Tdap/influenza vaccines: tdap current   OB History  Gravida Para Term Preterm AB Living  2 1   1   1   SAB IAB Ectopic Multiple Live Births        0 1    # Outcome Date GA Lbr Len/2nd Weight Sex Delivery Anes PTL Lv  2 Current           1 Preterm 11/27/18 [redacted]w[redacted]d   M CS-LTranv Spinal  LIV    Medical / Surgical History: Past medical history:  Past Medical History:  Diagnosis Date  . Medical history non-contributory   . Multiple sclerosis (HCC) 2021  . PCOS (polycystic ovarian syndrome)   . Post partum depression 2019  . Post-partum depression 2019    Past surgical history:  Past  Surgical History:  Procedure Laterality Date  . CESAREAN SECTION N/A 11/27/2018   Procedure: CESAREAN SECTION;  Surgeon: 11/29/2018, DO;  Location: [facility] LD [facility]S;  Service: Obstetrics;  Laterality: N/A;   Family History:  Family History  Problem Relation Age of Onset  . Cancer Maternal Grandmother   . Heart disease Maternal Grandfather   . Cancer Paternal Grandfather   . Cancer Maternal Aunt   . Cancer Paternal Aunt     Social History:  reports that she has never smoked. She has never used smokeless tobacco. She reports that she does not drink alcohol and does not use drugs.  Allergies: Patient has no known allergies.   Current Medications at time of admission:  Prior to Admission medications   Medication Sig Start Date End Date Taking? Authorizing Provider  Ferrous Sulfate (IRON PO) Take by mouth.   Yes [provider]  Glatiramer Acetate 40 MG/ML SOSY Inject into the skin.   Yes [provider]  prenatal vitamin w/FE, FA (PRENATAL 1 + 1) 27-1 MG TABS tablet Take 1 tablet by mouth daily at 12 noon. 11/30/18  Yes 12/02/18, CNM  VITAMIN D PO Take 5,000 Units by mouth daily.   Yes [provider]  acetaminophen (TYLENOL) 325 MG tablet Take 2 tablets (650 mg total) by mouth every 6 (six)  hours as needed for mild pain (or Fever >/= 101). 07/10/20   Rodolph Bong, MD    Review of Systems: Constitutional: Negative   HENT: Negative   Eyes: Negative   Respiratory: Negative   Cardiovascular: Negative   Gastrointestinal: Negative  Genitourinary: pos for bloody show, neg for LOF   Musculoskeletal: Negative   Skin: Negative   Neurological: Negative   Endo/Heme/Allergies: Negative   Psychiatric/Behavioral: Negative    Physical Exam: VS: Blood pressure (!) 107/53, pulse 70, temperature 98.2 F (36.8 C), temperature source Oral, resp. rate 16, height 5\' 2"  (1.575 m), weight 75.8 kg, SpO2 98 %, unknown if currently breastfeeding. AAO x3, no signs of  distress Cardiovascular: RRR Respiratory: Lung fields clear to ausculation GU/GI: Abdomen gravid, non-tender, non-distended, active FM, vertex, EFW 6.5# per Leopold's Extremities: no edema, negative for pain, tenderness, and cords  Cervical exam:Dilation: 5 Effacement (%): 80,90 Station: -2 Exam by:: K Torphy, RN FHR: baseline rate 125 / variability moderate / accelerations present / absent decelerations TOCO: 4-5   Prenatal Transfer Tool  Maternal Diabetes: No Genetic Screening: Declined Maternal Ultrasounds/Referrals: Normal Fetal Ultrasounds or other Referrals:  None Maternal Substance Abuse:  No Significant Maternal Medications:  Meds include: Other:  Glatiramer Acetate 40 MG/ML for MS management Significant Maternal Lab Results: Group B Strep negative    Assessment: 30 y.o. 26 [redacted]w[redacted]d  Active stage of labor FHR category 1 GBS neg Pain management plan: unmedicated labor and birth   Plan:  Admit to L&D Routine admission orders Epidural PRN  Dr [redacted]w[redacted]d notified of admission and will determine if Glatiramer Acetate 40 MG/ML is to be continued during this hospitalization.   Connye Burkitt MSN, CNM 02/16/2021 6:51 AM

## 2021-02-17 LAB — CBC
HCT: 27.9 % — ABNORMAL LOW (ref 36.0–46.0)
Hemoglobin: 9.1 g/dL — ABNORMAL LOW (ref 12.0–15.0)
MCH: 28.9 pg (ref 26.0–34.0)
MCHC: 32.6 g/dL (ref 30.0–36.0)
MCV: 88.6 fL (ref 80.0–100.0)
Platelets: 234 10*3/uL (ref 150–400)
RBC: 3.15 MIL/uL — ABNORMAL LOW (ref 3.87–5.11)
RDW: 13.4 % (ref 11.5–15.5)
WBC: 10.6 10*3/uL — ABNORMAL HIGH (ref 4.0–10.5)
nRBC: 0 % (ref 0.0–0.2)

## 2021-02-17 MED ORDER — IBUPROFEN 800 MG PO TABS: 800.0000 mg | ORAL_TABLET | Freq: Three times a day (TID) | ORAL | 0 refills | Status: DC

## 2021-02-17 NOTE — Lactation Note (Signed)
This note was copied from a baby's chart. Lactation Consultation Note  Patient Name: [name] Date: 02/17/2021   Age:31 hours  Mom has been using size 27 flanges with pumping. Mom appears to be a size 24, (possibly a size 21) flange.   Since Mom had an excellent supply with her 1st baby before her milk dried up, I do not think pumping after feedings is necessary. Parents report having 2 months worth of stored milk when her milk dried up.  Infant's latch looks good. Swallows are visible; Mom is comfortable with latch. Breast compressions were done to increase/maintain frequency of swallowing.  Infant to be circ'd later today. I discussed ways to entice infant to eat if sleepy. Parents' questions answered to their satisfaction.   Lurline Hare [facility] 02/17/2021, 9:58 AM

## 2021-02-17 NOTE — Progress Notes (Signed)
Postpartum Note Day # 1  S:  Patient doing well.  Pain controlled.  Tolerating regular diet.   Ambulating and voiding without difficulty.   Desires discharge home today if infant discharged. Denies fevers, chills, chest pain, SOB, N/V, or worsening bilateral LE edema.  Lochia: Minimal Infant feeding:  Breast Circumcision:  Desires prior to discharge Contraception:  None  O: Temp:  [97.9 F (36.6 C)-98.5 F (36.9 C)] 97.9 F (36.6 C) (06/09 0434) Pulse Rate:  [60-94] 78 (06/09 0434) Resp:  [15-18] 18 (06/09 0434) BP: (102-133)/(59-85) 118/76 (06/09 0434) SpO2:  [92 %-100 %] 98 % (06/09 0434) Gen: NAD, pleasant and cooperative CV: RRR Resp: CTAB, no wheezes/rales/rhonchi Abdomen: soft, non-distended, non-tender throughout Uterus: firm, non-tender, below umbilicus Ext: no bilateral LE edema, no bilateral calf tenderness, SCDs on and working  Labs:  Recent Labs    02/16/21 0209 02/17/21 0458  HGB 10.7* 9.1*    A/P: Patient is a 30 y.o. [id number] PPD#1 s/p VBAC.  S/p VBAC - Pain well controlled  - GU: UOP is adequate - GI: Tolerating regular diet - Activity: encouraged sitting up to chair and ambulation as tolerated - DVT Prophylaxis: Frequent ambulation - Labs: stable as above  Disposition: D/C home today if infant discharged.  Circumcision consent: Routine circumcisions performed on newborns have been identified as voluntary, elective procedures by [employer] such as the [employer].  It is considered an elective procedure with no definitive medical indication and carries risks.  Risks include but are not limited to bleeding, infection, damage to penis with possible need for further surgery, poor cosmesis, and local anesthetic risks.  Circumcision will only be performed if patient is deemed to have normal anatomy by his Pediatrician, meets adequate criteria for a newborn of similar gestational age after birth and is without infection or other  medical issue contraindicating an elective procedure.   Patient understands and agrees with above consent Patient discussed with parents of infant    Steva Ready, [place] [phone] (office)

## 2021-02-17 NOTE — Discharge Summary (Signed)
Postpartum Discharge Summary  Date of Service updated 02/17/21      Patient Name: Natalie Zimmerman DOB: 07-09-1991 MRN: [id number]  Date of admission: 02/16/2021 Delivery date:02/16/2021  Delivering provider: Drema Dallas  Date of discharge: 02/17/2021  Admitting diagnosis: Indication for care in labor or delivery [O75.9] Intrauterine pregnancy: [redacted]w[redacted]d    Secondary diagnosis:  Active Problems:   Indication for care in labor or delivery  Additional problems: Multiple sclerosis, History of preterm delivery, History of cesarean section    Discharge diagnosis: Term Pregnancy Delivered                                              Post partum procedures: None Augmentation: AROM Complications: None  Hospital course: Onset of Labor With Vaginal Delivery      30y.o. yo [id number] 312w2das admitted in Active Labor on 02/16/2021. Patient had an uncomplicated labor course as follows:  Membrane Rupture Time/Date: 7:41 AM ,02/16/2021   Delivery Method:Vaginal, Spontaneous  - Vacuum assisted  Episiotomy: None  Lacerations:  2nd degree  Patient had an uncomplicated postpartum course.  She is ambulating, tolerating a regular diet, passing flatus, and urinating well. Patient is discharged home in stable condition on 02/17/21.  Newborn Data: Birth date:02/16/2021  Birth time:2:19 PM  Gender:Female  Living status:Living  Apgars:9 ,9  Weight:3246 g   Magnesium Sulfate received: No BMZ received: No Rhophylac:No MMR:N/A T-DaP:Given prenatally Transfusion:No  Physical exam  Vitals:   02/16/21 1805 02/16/21 2202 02/17/21 0047 02/17/21 0434  BP: 118/68 117/75 108/67 118/76  Pulse: 73 78 78 78  Resp: 18 18 18 18   Temp: 98.3 F (36.8 C) 98.2 F (36.8 C) 98 F (36.7 C) 97.9 F (36.6 C)  TempSrc: Oral Oral Oral Oral  SpO2:  98% 99% 98%  Weight:      Height:       General: alert, cooperative, and no distress Lochia: appropriate Uterine Fundus: firm Incision: N/A DVT Evaluation: No evidence of  DVT seen on physical exam. Labs: Lab Results  Component Value Date   WBC 10.6 (H) 02/17/2021   HGB 9.1 (L) 02/17/2021   HCT 27.9 (L) 02/17/2021   MCV 88.6 02/17/2021   PLT 234 02/17/2021   CMP Latest Ref Rng & Units 07/10/2020  Glucose 70 - 99 mg/dL 138(H)  BUN 6 - 20 mg/dL 16  Creatinine 0.44 - 1.00 mg/dL 0.65  Sodium 135 - 145 mmol/L 139  Potassium 3.5 - 5.1 mmol/L 4.3  Chloride 98 - 111 mmol/L 107  CO2 22 - 32 mmol/L 23  Calcium 8.9 - 10.3 mg/dL 8.6(L)  Total Protein 6.5 - 8.1 g/dL -  Total Bilirubin 0.3 - 1.2 mg/dL -  Alkaline Phos 38 - 126 U/L -  AST 15 - 41 U/L -  ALT 0 - 44 U/L -   Edinburgh Score: No flowsheet data found.    After visit meds:  Allergies as of 02/17/2021   No Known Allergies      Medication List     TAKE these medications    acetaminophen 325 MG tablet Commonly known as: TYLENOL Take 2 tablets (650 mg total) by mouth every 6 (six) hours as needed for mild pain (or Fever >/= 101).   Glatiramer Acetate 40 MG/ML Sosy Inject into the skin.   ibuprofen 800 MG tablet Commonly known as: ADVIL Take 1 tablet (  800 mg total) by mouth every 8 (eight) hours.   IRON PO Take by mouth.   prenatal vitamin w/FE, FA 27-1 MG Tabs tablet Take 1 tablet by mouth daily at 12 noon.   VITAMIN D PO Take 5,000 Units by mouth daily.         Discharge home in stable condition Infant Feeding: Breast Infant Disposition:home with mother Discharge instruction: per After Visit Summary and Postpartum booklet. Activity: Advance as tolerated. Pelvic rest for 6 weeks.  Diet: routine diet Anticipated Birth Control:  None Postpartum Appointment:6 weeks Additional Postpartum F/U:  None Future Appointments: Future Appointments  Date Time Provider [place]  06/09/2021 11:00 AM Sater, Nanine Means, MD [facility] None   Follow up Visit:  Follow-up Information     Drema Dallas, DO Follow up in 6 week(s).   Specialty: Obstetrics and Gynecology Why: We  will arrange your 6 week postpartum visit. Contact information: [place] [address] [phone]                     02/17/2021 Drema Dallas, DO

## 2021-02-17 NOTE — Social Work (Signed)
CSW received consult for hx of Postpartum Depression.  CSW met with MOB to offer support and complete assessment.     CSW introduced self and role. CSW observed MOB feeding infant 'Bodhi' and FOB in restroom. MOB provided permission to complete assessment with FOB present. CSW informed MOB of reason for consult and assessed current emotions. MOB reported she is currently feeling well. CSW inquired on MOB history of PPD. MOB disclosed she experienced PPD following the birth of her son in 2020, who was in the NICU for a few weeks. MOB stated it presented immediately and lasted about two months. MOB shared she coped with the assistance of friends and family. CSW asked MOB if she can tell an emotional difference now versus her previous birth experience. MOB stated she is feeling better than she was in 2020, at this stage of postpartum recovery. MOB denies ever being on medication or attending therapy. Aside from FOB, MOB identified her sisters as primary supports. MOB denies any current SI or HI.  CSW provided education regarding the baby blues period versus PPD and provided resources. CSW provided the New Mom Checklist and encouraged MOB to self evaluate and contact a medical professional if symptoms are noted at any time. MOB expressed understanding and stated she feels comfortable seeking support if mental health needs arise.  CSW provided review of Sudden Infant Death Syndrome (SIDS) precautions. MOB stated she has all essentials for infant, including a bedside bassinet and car seat. MOB identified [facility] for follow-up care and denies any barriers to care. MOB expressed no interest in WIC resources and stated she has no additional needs at this time.   CSW identifies no further need for intervention and no barriers to discharge at this time.  Haylynn Pha, LCSWA Clinical Social Work [facility] [phone] 

## 2021-02-18 LAB — SURGICAL PATHOLOGY

## 2021-03-31 DIAGNOSIS — N898 Other specified noninflammatory disorders of vagina: Secondary | ICD-10-CM | POA: Diagnosis not present

## 2021-04-13 DIAGNOSIS — N898 Other specified noninflammatory disorders of vagina: Secondary | ICD-10-CM | POA: Diagnosis not present

## 2021-04-25 ENCOUNTER — Telehealth: Payer: Self-pay | Admitting: Neurology

## 2021-04-25 NOTE — Telephone Encounter (Signed)
Pt called back and accepted appt for this Thursday at 9am w/ Dr. Epimenio Foot. Phone staff scheduled pt.

## 2021-04-25 NOTE — Telephone Encounter (Signed)
Called, LVM for pt to call office back to schedule appt. Would need to get her in for appt to see Dr. Epimenio Foot.

## 2021-04-25 NOTE — Telephone Encounter (Signed)
Pt states she jut had a baby, and she was informed that her MS might flare up. Pt states she had a migraine for 3 weeks now and they are really bad in the mornings. She also has numbness in her hands and legs at night. Pt requesting a call back.

## 2021-04-28 ENCOUNTER — Ambulatory Visit: Payer: BC Managed Care – PPO | Admitting: Neurology

## 2021-04-28 ENCOUNTER — Encounter: Payer: Self-pay | Admitting: Neurology

## 2021-04-28 VITALS — BP 121/82 | HR 92 | Ht 62.0 in | Wt 145.0 lb

## 2021-04-28 DIAGNOSIS — G35 Multiple sclerosis: Secondary | ICD-10-CM | POA: Diagnosis not present

## 2021-04-28 DIAGNOSIS — H532 Diplopia: Secondary | ICD-10-CM

## 2021-04-28 DIAGNOSIS — R200 Anesthesia of skin: Secondary | ICD-10-CM | POA: Diagnosis not present

## 2021-04-28 NOTE — Progress Notes (Signed)
[facility]  PATIENT: Natalie Zimmerman DOB: 1991/03/31  REFERRING DOCTOR OR PCP: Ramiro Harvest, MD SOURCE: Patient, notes from recent hospitalization, imaging and lab reports, MRI images personally reviewed.  _________________________________   HISTORICAL  CHIEF COMPLAINT:  Chief Complaint  Patient presents with   Follow-up    Pt alone, rm 2. At night she has noticed her hands/feet going numb. She has had a migraine ongoing for 3 weeks and unable to get relief.    HISTORY OF PRESENT ILLNESS:  Natalie Zimmerman is a 30 y.o. 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  Update 04/28/2021: She was diagnosed with MS 2020 when about [redacted] weeks pregnant.  Treatment options were limited and she started Copaxone.  She is tolerating Copaxone fairly well but has some skin reactions.   She has since delivered.   She has numbness in her hands and eet at night.   Gait, strengh, coordination and vision are fine.       Bladder function is fine.    The dysesthesias are mostly at night ad they start as she lays down.  She needs to twist her arms and moves her ankles to get comfortable at times.    Once asleep, she does well.   We discussed unclear if related to MS.   She does have foci at [facility] and C3C4, T1T2, T3, T3T4, T6 and T11T12.     She rarely gets a Lhermitte sign (1-2/month)   She denies much fatigue.  Mood and cognition are doing well.  She delivered her son 02/16/2021 .  She also has a sone born in 2020.   She is breastfeeding is plans to do so x 10 more months.   She is not planning a third child.       MS history: She had the onset of blurry vision when she looked to her left July 01, 2020.  This worsened over the next two days, over the weekend, especially looking at headlights while driving.   She saw ophthalmology 07/06/2020 and they suspected a neurologic disorder.   She was referred to the [facility] ED 07/06/2020.   MRIs of the brain and spine showed multiple lesions consistent with MS.  Some of the foci enhanced after contrast consistent with more acute lesions in confirming the diagnosis. She was admitted and had 5 days of IV SOlu-Medrol.   Symptoms improved and she was discharged home.   While in the hospital, it was discovered that she was about 6 to [redacted] weeks pregnant.     Besides diplopia, she had no other symptoms at the time.   However, in the past, she did note some numbness or a hot sensation in her feet but never > 1 day.   However, she had intermittent Lhermitte signs since July 2021.     She denies current numbness, weakness or ataxia.   Gait is fine and stairs are not a problem without holding the bannister.  Bladder function is fine.  She has no FH of MS or autoimmune disease.     Imaging: MRI of the brain and orbits 07/06/2020 showed multiple T2/FLAIR hyperintense foci predominantly in the periventricular, juxtacortical and deep white matter of the hemispheres. Many of the periventricular foci are radially oriented to the ventricles.  At least 13 foci enhanced after contrast was administered.  The enhancing foci were very small and would not be expected to be symptomatic.Marland Kitchen There were just a couple small nonenhancing nonenhancing foci in the brainstem and cerebellum, none  in a position that would be expected to cause diplopia. Optic nerves were normal.  MRI of the cervical and thoracic spine performed 07/07/2020 showed T2 hyperintense foci posteriorly at C2-C3 and towards the left at C3-C4, anteriorly at T1-T2, to the left at T3, to the right at T3-T4, centrally at T5, to the anterolateral right at T6 and centrally at T11-T12. Due to her pregnancy, the study was done without contrast.  REVIEW OF SYSTEMS: Constitutional: No fevers, chills, sweats, or change in appetite Eyes: No visual changes, double vision, eye pain Ear, nose and throat: No hearing loss, ear pain, nasal congestion, sore throat Cardiovascular: No chest pain, palpitations Respiratory:  No shortness of breath  at rest or with exertion.   No wheezes GastrointestinaI: No nausea, vomiting, diarrhea, abdominal pain, fecal incontinence Genitourinary:  No dysuria, urinary retention or frequency.  No nocturia. Musculoskeletal:  No neck pain, back pain Integumentary: No rash, pruritus, skin lesions Neurological: as above Psychiatric: No depression at this time.  No anxiety Endocrine: No palpitations, diaphoresis, change in appetite, change in weigh or increased thirst Hematologic/Lymphatic:  No anemia, purpura, petechiae. Allergic/Immunologic: No itchy/runny eyes, nasal congestion, recent allergic reactions, rashes  ALLERGIES: No Known Allergies  HOME MEDICATIONS:  Current Outpatient Medications:    acetaminophen (TYLENOL) 325 MG tablet, Take 2 tablets (650 mg total) by mouth every 6 (six) hours as needed for mild pain (or Fever >/= 101)., Disp: , Rfl:    Ferrous Sulfate (IRON PO), Take by mouth., Disp: , Rfl:    Glatiramer Acetate 40 MG/ML SOSY, Inject into the skin., Disp: , Rfl:    ibuprofen (ADVIL) 800 MG tablet, Take 1 tablet (800 mg total) by mouth every 8 (eight) hours., Disp: 30 tablet, Rfl: 0   prenatal vitamin w/FE, FA (PRENATAL 1 + 1) 27-1 MG TABS tablet, Take 1 tablet by mouth daily at 12 noon., Disp: 30 each, Rfl: 0   VITAMIN D PO, Take 5,000 Units by mouth daily., Disp: , Rfl:   PAST MEDICAL HISTORY: Past Medical History:  Diagnosis Date   Medical history non-contributory    Multiple sclerosis (HCC) 2021   PCOS (polycystic ovarian syndrome)    Post partum depression 2019   Post-partum depression 2019    PAST SURGICAL HISTORY: Past Surgical History:  Procedure Laterality Date   CESAREAN SECTION N/A 11/27/2018   Procedure: CESAREAN SECTION;  Surgeon: Myna Hidalgo, DO;  Location: [facility] LD ORS;  Service: Obstetrics;  Laterality: N/A;    FAMILY HISTORY: Family History  Problem Relation Age of Onset   Cancer Maternal Grandmother    Heart disease Maternal Grandfather    Cancer  Paternal Grandfather    Cancer Maternal Aunt    Cancer Paternal Aunt     SOCIAL HISTORY:  Social History   Socioeconomic History   Marital status: Single    Spouse name: Not on file   Number of children: 1   Years of education: Not on file   Highest education level: Not on file  Occupational History   Occupation: [employer]  Tobacco Use   Smoking status: Never   Smokeless tobacco: Never  Vaping Use   Vaping Use: Never used  Substance and Sexual Activity   Alcohol use: No    Alcohol/week: 0.0 standard drinks   Drug use: No   Sexual activity: Yes  Other Topics Concern   Not on file  Social History Narrative   Lives with partner and son   Caffeine use: Coffee    Right  handed   Social Determinants of Health   Financial Resource Strain: Not on file  Food Insecurity: Not on file  Transportation Needs: Not on file  Physical Activity: Not on file  Stress: Not on file  Social Connections: Not on file  Intimate Partner Violence: Not on file     PHYSICAL EXAM  Vitals:   04/28/21 1148  BP: 121/82  Pulse: 92  Weight: 145 lb (65.8 kg)  Height: 5\' 2"  (1.575 m)    Body mass index is 26.52 kg/m.  General: The patient is well-developed and well-nourished and in no acute distress  HEENT:  Head is Steen/AT.  Sclera are anicteric.   Skin: Extremities are without rash or  edema.  Neurologic Exam  Mental status: The patient is alert and oriented x 3 at the time of the examination. The patient has apparent normal recent and remote memory, with an apparently normal attention span and concentration ability.   Speech is normal.  Cranial nerves: Extraocular movements are full.  Facial strength and sensation was normal.  Hearing was normal. Motor:  Muscle bulk is normal.   Tone is normal. Strength is  5 / 5 in all 4 extremities.   Sensory: Sensory testing is intact to pinprick, soft touch and vibration sensation in all 4 extremities.  Coordination: Cerebellar  testing reveals good finger-nose-finger and heel-to-shin bilaterally.  Gait and station: Station is normal.   Gait is normal. Tandem gait is normal. Romberg is negative.   Reflexes: Deep tendon reflexes were normal.     DIAGNOSTIC DATA (LABS, IMAGING, TESTING) - I reviewed patient records, labs, notes, testing and imaging myself where available.  Lab Results  Component Value Date   WBC 10.6 (H) 02/17/2021   HGB 9.1 (L) 02/17/2021   HCT 27.9 (L) 02/17/2021   MCV 88.6 02/17/2021   PLT 234 02/17/2021      Component Value Date/Time   NA 139 07/10/2020 0444   K 4.3 07/10/2020 0444   CL 107 07/10/2020 0444   CO2 23 07/10/2020 0444   GLUCOSE 138 (H) 07/10/2020 0444   BUN 16 07/10/2020 0444   CREATININE 0.65 07/10/2020 0444   CALCIUM 8.6 (L) 07/10/2020 0444   PROT 7.8 07/06/2020 1731   ALBUMIN 4.5 07/06/2020 1731   AST 17 07/06/2020 1731   ALT 15 07/06/2020 1731   ALKPHOS 48 07/06/2020 1731   BILITOT 0.8 07/06/2020 1731   GFRNONAA >60 07/10/2020 0444   GFRAA >60 11/27/2018 1957   No results found for: CHOL, HDL, LDLCALC, LDLDIRECT, TRIG, CHOLHDL Lab Results  Component Value Date   HGBA1C 5.4 07/07/2020       ASSESSMENT AND PLAN  Multiple sclerosis (HCC) - Plan: MR BRAIN W WO CONTRAST  Numbness - Plan: MR BRAIN W WO CONTRAST  Diplopia  1.   For now, she will continue Copaxone.  We will check MRI brain to see if breakthrough activity.  If she has breakthrough activity  we would need to step up the efficacy, I would recommend Ocrevus or Tysabri as she is breastfeeding.  After breast-feeding, if she wants to switch to one of the oral agents we could also consider that. 2.   Stay active exercise as tolerated. 3.   Return in 6 months or sooner for new or worsening neurologic symptoms.    Ryver Poblete A. 07/09/2020, MD, [facility] 04/28/2021, 12:10 PM Certified in Neurology, Clinical Neurophysiology, Sleep Medicine and Neuroimaging  [facility] [address],  [address] [place], [place] [phone])  [phone] 

## 2021-05-03 ENCOUNTER — Telehealth: Payer: Self-pay | Admitting: Neurology

## 2021-05-03 NOTE — Telephone Encounter (Signed)
LVM for pt to call back about scheduling mri  [employer] auth: [id number] (exp. 05/02/21 to 05/31/21)

## 2021-05-03 NOTE — Telephone Encounter (Signed)
Patient returned my call she is scheduled at [facility] 05/10/21.

## 2021-05-10 ENCOUNTER — Ambulatory Visit (INDEPENDENT_AMBULATORY_CARE_PROVIDER_SITE_OTHER): Payer: BC Managed Care – PPO

## 2021-05-10 DIAGNOSIS — G35 Multiple sclerosis: Secondary | ICD-10-CM

## 2021-05-10 DIAGNOSIS — R200 Anesthesia of skin: Secondary | ICD-10-CM

## 2021-05-10 MED ORDER — GADOBENATE DIMEGLUMINE 529 MG/ML IV SOLN
15.0000 mL | Freq: Once | INTRAVENOUS | Status: AC | PRN
  Administered 2021-05-10: 15 mL via INTRAVENOUS

## 2021-05-11 ENCOUNTER — Other Ambulatory Visit (INDEPENDENT_AMBULATORY_CARE_PROVIDER_SITE_OTHER): Payer: Self-pay

## 2021-05-11 ENCOUNTER — Telehealth: Payer: Self-pay | Admitting: Neurology

## 2021-05-11 ENCOUNTER — Encounter: Payer: Self-pay | Admitting: Neurology

## 2021-05-11 DIAGNOSIS — Z79899 Other long term (current) drug therapy: Secondary | ICD-10-CM | POA: Diagnosis not present

## 2021-05-11 DIAGNOSIS — G35 Multiple sclerosis: Secondary | ICD-10-CM

## 2021-05-11 DIAGNOSIS — Z0289 Encounter for other administrative examinations: Secondary | ICD-10-CM

## 2021-05-11 NOTE — Telephone Encounter (Signed)
I spoke with her and she will go on one of the infusions (needs increased efficacy, safer for breastfeeding than an oral agent, failed copaxone)  I will place orders for labs.     She can sign Tysabri and Ocrevus forms when she arrives and we will discuss results with her next week when labs return

## 2021-05-11 NOTE — Telephone Encounter (Signed)
I called and left a message.  I was going to discuss the results of the brain MRI.  It did show that there were several enhancing lesions.  Therefore, I think we need to step up and efficacy from the Copaxone (she was placed on that medication due to pregnancy).  At the last visit we had also discussed Tysabri and Ocrevus and I would like to get her started on 1 of these.    I will try to call her again later today.  We will either bring her in to get lab work or to discuss these options and possibly other options in further detail.

## 2021-05-14 LAB — CBC WITH DIFFERENTIAL/PLATELET
Basophils Absolute: 0.1 10*3/uL (ref 0.0–0.2)
Basos: 1 %
EOS (ABSOLUTE): 0.2 10*3/uL (ref 0.0–0.4)
Eos: 2 %
Hematocrit: 37.8 % (ref 34.0–46.6)
Hemoglobin: 12.2 g/dL (ref 11.1–15.9)
Immature Grans (Abs): 0 10*3/uL (ref 0.0–0.1)
Immature Granulocytes: 0 %
Lymphocytes Absolute: 2 10*3/uL (ref 0.7–3.1)
Lymphs: 27 %
MCH: 27.4 pg (ref 26.6–33.0)
MCHC: 32.3 g/dL (ref 31.5–35.7)
MCV: 85 fL (ref 79–97)
Monocytes Absolute: 0.5 10*3/uL (ref 0.1–0.9)
Monocytes: 6 %
Neutrophils Absolute: 4.9 10*3/uL (ref 1.4–7.0)
Neutrophils: 64 %
Platelets: 332 10*3/uL (ref 150–450)
RBC: 4.45 x10E6/uL (ref 3.77–5.28)
RDW: 14.8 % (ref 11.7–15.4)
WBC: 7.7 10*3/uL (ref 3.4–10.8)

## 2021-05-14 LAB — COMPREHENSIVE METABOLIC PANEL
ALT: 24 IU/L (ref 0–32)
AST: 19 IU/L (ref 0–40)
Albumin/Globulin Ratio: 1.7 (ref 1.2–2.2)
Albumin: 4.8 g/dL (ref 3.8–4.8)
Alkaline Phosphatase: 98 IU/L (ref 44–121)
BUN/Creatinine Ratio: 27 — ABNORMAL HIGH (ref 9–23)
BUN: 20 mg/dL (ref 6–20)
Bilirubin Total: <0.2 mg/dL (ref 0.0–1.2)
CO2: 24 mmol/L (ref 20–29)
Calcium: 9.4 mg/dL (ref 8.7–10.2)
Chloride: 106 mmol/L (ref 96–106)
Creatinine, Ser: 0.73 mg/dL (ref 0.57–1.00)
Globulin, Total: 2.8 g/dL (ref 1.5–4.5)
Glucose: 77 mg/dL (ref 65–99)
Potassium: 4.4 mmol/L (ref 3.5–5.2)
Sodium: 144 mmol/L (ref 134–144)
Total Protein: 7.6 g/dL (ref 6.0–8.5)
eGFR: 113 mL/min/{1.73_m2} (ref >59)

## 2021-05-14 LAB — HEPATITIS B SURFACE ANTIBODY,QUALITATIVE: Hep B Surface Ab, Qual: NONREACTIVE

## 2021-05-14 LAB — QUANTIFERON-TB GOLD PLUS
QuantiFERON Mitogen Value: >10 IU/mL
QuantiFERON Nil Value: 0.02 IU/mL
QuantiFERON TB1 Ag Value: 0.01 IU/mL
QuantiFERON TB2 Ag Value: 0.01 IU/mL
QuantiFERON-TB Gold Plus: NEGATIVE

## 2021-05-14 LAB — HEPATITIS B SURFACE ANTIGEN: Hepatitis B Surface Ag: NEGATIVE

## 2021-05-14 LAB — IGG, IGA, IGM
IgA/Immunoglobulin A, Serum: 367 mg/dL — ABNORMAL HIGH (ref 87–352)
IgG (Immunoglobin G), Serum: 989 mg/dL (ref 586–1602)
IgM (Immunoglobulin M), Srm: 81 mg/dL (ref 26–217)

## 2021-05-14 LAB — HEPATITIS B CORE ANTIBODY, TOTAL: Hep B Core Total Ab: NEGATIVE

## 2021-05-14 LAB — HIV ANTIBODY (ROUTINE TESTING W REFLEX): HIV Screen 4th Generation wRfx: NONREACTIVE

## 2021-05-14 LAB — HEPATITIS C ANTIBODY: Hep C Virus Ab: <0.1 s/co ratio (ref 0.0–0.9)

## 2021-05-14 LAB — VARICELLA ZOSTER ANTIBODY, IGG: Varicella zoster IgG: 941 index (ref >165)

## 2021-05-17 ENCOUNTER — Telehealth: Payer: Self-pay | Admitting: Neurology

## 2021-05-17 NOTE — Telephone Encounter (Signed)
Pt called in. States that she gave her injection in her left side of abd today around 2pm and shortly after giving the injection she noticed her face became numb, body was shakey all over. Her rt arm was hot feeling and eyes were red. She mentioned her face was flushed.  I was able to speak with Dr Epimenio Foot about this and he advised that this was common at times can be given accidentally in a vein. Since she was ok and symptoms resolved he states nothing else needed at this time

## 2021-05-24 ENCOUNTER — Telehealth: Payer: Self-pay

## 2021-05-24 NOTE — Telephone Encounter (Signed)
I spoke with Dr. Epimenio Foot.  He reviewed patient's lab work and recommends that she start Ocrevus.  I called patient.  I discussed this with her.  She is agreeable to starting Ocrevus.  She will let us know within the next month if she has not heard from the infusion suite to schedule her Ocrevus infusion.  Ocrevus start form faxed to [place].  Received a receipt of information.  Ocrevus order and start form and clinical information given to infusion suite.

## 2021-06-09 ENCOUNTER — Ambulatory Visit: Payer: BC Managed Care – PPO | Admitting: Neurology

## 2021-06-14 ENCOUNTER — Other Ambulatory Visit: Payer: Self-pay | Admitting: Neurology

## 2021-06-20 DIAGNOSIS — J069 Acute upper respiratory infection, unspecified: Secondary | ICD-10-CM | POA: Diagnosis not present

## 2021-06-20 DIAGNOSIS — Z03818 Encounter for observation for suspected exposure to other biological agents ruled out: Secondary | ICD-10-CM | POA: Diagnosis not present

## 2021-07-04 ENCOUNTER — Telehealth: Payer: Self-pay | Admitting: Neurology

## 2021-07-04 NOTE — Telephone Encounter (Signed)
Tried calling pt, mailbox full, unable to leave message.  I spoke w/ infusion suite. She is scheduled for first 1/2 dose of Ocrevus w/ Intrafusion 07/06/21. Second dose scheduled for 07/26/21. No need to refill glatiramer, she can come this week for scheduled Ocrevus.

## 2021-07-04 NOTE — Telephone Encounter (Signed)
Joni with [facility] called requesting refill for Glatiramer Acetate 40 MG/ML SOSY. Pharmacy [facility] - [place], [place] - [address] [facility].

## 2021-07-06 ENCOUNTER — Telehealth: Payer: Self-pay | Admitting: Neurology

## 2021-07-06 DIAGNOSIS — G35 Multiple sclerosis: Secondary | ICD-10-CM | POA: Diagnosis not present

## 2021-07-06 NOTE — Telephone Encounter (Signed)
Pt will no longer by taking the glatiramer acetate injections. She has switched to an alternative therapy.

## 2021-07-06 NOTE — Telephone Encounter (Signed)
[facility] called stating that the pt's Glatiramer Acetate 40 MG/ML SOSY prescription will be expiring in 3 weeks and they are needing a new Rx sent to them. Please advise.

## 2021-07-12 NOTE — Telephone Encounter (Signed)
Patient had her first Ocrevus infusion on 07/06/2021.

## 2021-07-26 DIAGNOSIS — G35 Multiple sclerosis: Secondary | ICD-10-CM | POA: Diagnosis not present

## 2021-08-05 DIAGNOSIS — R059 Cough, unspecified: Secondary | ICD-10-CM | POA: Diagnosis not present

## 2021-08-05 DIAGNOSIS — J0100 Acute maxillary sinusitis, unspecified: Secondary | ICD-10-CM | POA: Diagnosis not present

## 2021-08-05 DIAGNOSIS — H10023 Other mucopurulent conjunctivitis, bilateral: Secondary | ICD-10-CM | POA: Diagnosis not present

## 2021-08-05 DIAGNOSIS — R0981 Nasal congestion: Secondary | ICD-10-CM | POA: Diagnosis not present

## 2021-08-06 ENCOUNTER — Encounter: Payer: Self-pay | Admitting: Neurology

## 2021-08-18 ENCOUNTER — Telehealth: Payer: Self-pay | Admitting: Neurology

## 2021-08-18 NOTE — Telephone Encounter (Signed)
LVM and sent mychart msg informing pt of appt change- MD out. 

## 2021-11-03 ENCOUNTER — Ambulatory Visit: Payer: BC Managed Care – PPO | Admitting: Neurology

## 2021-11-15 ENCOUNTER — Ambulatory Visit: Payer: BC Managed Care – PPO | Admitting: Neurology

## 2021-11-15 ENCOUNTER — Encounter: Payer: Self-pay | Admitting: Neurology

## 2021-11-15 VITALS — BP 133/95 | HR 89 | Ht 62.0 in | Wt 152.0 lb

## 2021-11-15 DIAGNOSIS — Z79899 Other long term (current) drug therapy: Secondary | ICD-10-CM

## 2021-11-15 DIAGNOSIS — R200 Anesthesia of skin: Secondary | ICD-10-CM | POA: Diagnosis not present

## 2021-11-15 DIAGNOSIS — R269 Unspecified abnormalities of gait and mobility: Secondary | ICD-10-CM

## 2021-11-15 DIAGNOSIS — G35 Multiple sclerosis: Secondary | ICD-10-CM

## 2021-11-15 NOTE — Progress Notes (Signed)
[facility]  PATIENT: Natalie Zimmerman DOB: 25-Mar-1991  REFERRING DOCTOR OR PCP: Ramiro Harvest, MD SOURCE: Patient, notes from recent hospitalization, imaging and lab reports, MRI images personally reviewed.  _________________________________   HISTORICAL  CHIEF COMPLAINT:  Chief Complaint  Patient presents with   Follow-up    Pt alone, rm 3 (NCV) pt has noted that the numbness that she complained previously has extended from legs to generalized all over.    DMT Treatment    Pt started Ocrevus in Nov and has next dose due May. Received through [facility] infusion suite    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  Update 04/28/2021: She was diagnosed with MS 2020 when about [redacted] weeks pregnant.  She switched form Copaxone to Ocrevus recently and tolerates it well.       Gait, strengh, coordination and vision are fine.       Bladder function is fine.    She is experiencing numbness, tingling with some dysesthesia bilaterally, starting at night worse when she wakes up.   She also feels stiff upon awakening.    She feels better after she moves around.   If she wakes up (not most nights) she has troulbe falling back asleep due to the dysesthesias.    She does have foci at [facility] and C3C4, T1T2, T3, T3T4, T6 and T11T12.     She rarely gets a Lhermitte sign (1-2/month)   She has some fatigue.   She notes generally more with her 38 month old.  Mood and cognition are doing well.  She delivered her son 02/16/2021 .  She also has a son born in 2020.   She stopped  breastfeeding in January.   She is not planning on more  MS history: She had the onset of blurry vision when she looked to her left July 01, 2020.  This worsened over the next two days, over the weekend, especially looking at headlights while driving.   She saw ophthalmology 07/06/2020 and they suspected a neurologic disorder.   She was referred to the [facility] ED 07/06/2020.   MRIs  of the brain and spine showed multiple lesions consistent with MS. Some of the foci enhanced after contrast consistent with more acute lesions in confirming the diagnosis. She was admitted and had 5 days of IV SOlu-Medrol.   Symptoms improved and she was discharged home.   While in the hospital, it was discovered that she was about 6 to [redacted] weeks pregnant.     Besides diplopia, she had no other symptoms at the time.   However, in the past, she did note some numbness or a hot sensation in her feet but never > 1 day.   However, she had intermittent Lhermitte signs since July 2021.     She denies current numbness, weakness or ataxia.   Gait is fine and stairs are not a problem without holding the bannister.  Bladder function is fine.Switched to [employer] 06/2021 due to breakthrough activity whil on Copaxone     She has no FH of MS or autoimmune disease.     Imaging: MRI 05/10/2021 showed multiple T2/FLAIR hyperintense foci in the hemispheres in a pattern and configuration consistent with demyelinating plaques associated with multiple sclerosis.  Four of the foci enhance after contrast consistent with acute or subacute lesions.  Besides these foci, an additional focus in the deep white matter of the right frontal lobe was not present  on the previous MRI  MRI of the brain and orbits 07/06/2020 showed multiple T2/FLAIR hyperintense foci predominantly in the periventricular, juxtacortical and deep white matter of the hemispheres. Many of the periventricular foci are radially oriented to the ventricles.  At least 13 foci enhanced after contrast was administered.  The enhancing foci were very small and would not be expected to be symptomatic.Marland Kitchen There were just a couple small nonenhancing nonenhancing foci in the brainstem and cerebellum, none in a position that would be expected to cause diplopia. Optic nerves were normal.  MRI of the cervical and thoracic spine performed 07/07/2020 showed T2 hyperintense foci posteriorly  at C2-C3 and towards the left at C3-C4, anteriorly at T1-T2, to the left at T3, to the right at T3-T4, centrally at T5, to the anterolateral right at T6 and centrally at T11-T12. Due to her pregnancy, the study was done without contrast.  REVIEW OF SYSTEMS: Constitutional: No fevers, chills, sweats, or change in appetite Eyes: No visual changes, double vision, eye pain Ear, nose and throat: No hearing loss, ear pain, nasal congestion, sore throat Cardiovascular: No chest pain, palpitations Respiratory:  No shortness of breath at rest or with exertion.   No wheezes GastrointestinaI: No nausea, vomiting, diarrhea, abdominal pain, fecal incontinence Genitourinary:  No dysuria, urinary retention or frequency.  No nocturia. Musculoskeletal:  No neck pain, back pain Integumentary: No rash, pruritus, skin lesions Neurological: as above Psychiatric: No depression at this time.  No anxiety Endocrine: No palpitations, diaphoresis, change in appetite, change in weigh or increased thirst Hematologic/Lymphatic:  No anemia, purpura, petechiae. Allergic/Immunologic: No itchy/runny eyes, nasal congestion, recent allergic reactions, rashes  ALLERGIES: No Known Allergies  HOME MEDICATIONS:  Current Outpatient Medications:    Ocrelizumab (OCREVUS IV), Inject into the vein every 6 (six) months., Disp: , Rfl:   PAST MEDICAL HISTORY: Past Medical History:  Diagnosis Date   Medical history non-contributory    Multiple sclerosis (HCC) 2021   PCOS (polycystic ovarian syndrome)    Post partum depression 2019   Post-partum depression 2019    PAST SURGICAL HISTORY: Past Surgical History:  Procedure Laterality Date   CESAREAN SECTION N/A 11/27/2018   Procedure: CESAREAN SECTION;  Surgeon: Myna Hidalgo, DO;  Location: [facility] LD ORS;  Service: Obstetrics;  Laterality: N/A;    FAMILY HISTORY: Family History  Problem Relation Age of Onset   Cancer Maternal Grandmother    Heart disease Maternal Grandfather     Cancer Paternal Grandfather    Cancer Maternal Aunt    Cancer Paternal Aunt     SOCIAL HISTORY:  Social History   Socioeconomic History   Marital status: Single    Spouse name: Not on file   Number of children: 1   Years of education: Not on file   Highest education level: Not on file  Occupational History   Occupation: [employer]  Tobacco Use   Smoking status: Never   Smokeless tobacco: Never  Vaping Use   Vaping Use: Never used  Substance and Sexual Activity   Alcohol use: No    Alcohol/week: 0.0 standard drinks   Drug use: No   Sexual activity: Yes  Other Topics Concern   Not on file  Social History Narrative   Lives with partner and son   Caffeine use: Coffee    Right handed   Social Determinants of Health   Financial Resource Strain: Not on file  Food Insecurity: Not on file  Transportation Needs: Not on file  Physical Activity:  Not on file  Stress: Not on file  Social Connections: Not on file  Intimate Partner Violence: Not on file     PHYSICAL EXAM  Vitals:   11/15/21 0948  BP: (!) 133/95  Pulse: 89  Weight: 152 lb (68.9 kg)  Height: 5\' 2"  (1.575 m)    Body mass index is 27.8 kg/m.  General: The patient is well-developed and well-nourished and in no acute distress  HEENT:  Head is [place]/AT.  Sclera are anicteric.   Skin: Extremities are without rash or  edema.  Neurologic Exam  Mental status: The patient is alert and oriented x 3 at the time of the examination. The patient has apparent normal recent and remote memory, with an apparently normal attention span and concentration ability.  speech is normal.  Cranial nerves: Extraocular movements are full.  Facial strength and sensation were normal.    Hearing was normal. Motor:  Muscle bulk is normal.   Tone is normal. Strength is  5 / 5 in all 4 extremities.   Sensory: Sensory testing is intact to pinprick, soft touch and vibration sensation in all 4 extremities.  Coordination:  Cerebellar testing reveals good finger-nose-finger and heel-to-shin bilaterally.  Gait and station: Station is normal.   Gait is normal.  The tandem gait is mildly wide.  Romberg is negative.   Reflexes: Deep tendon reflexes were normal.     DIAGNOSTIC DATA (LABS, IMAGING, TESTING) - I reviewed patient records, labs, notes, testing and imaging myself where available.  Lab Results  Component Value Date   WBC 7.7 05/11/2021   HGB 12.2 05/11/2021   HCT 37.8 05/11/2021   MCV 85 05/11/2021   PLT 332 05/11/2021      Component Value Date/Time   NA 144 05/11/2021 1339   K 4.4 05/11/2021 1339   CL 106 05/11/2021 1339   CO2 24 05/11/2021 1339   GLUCOSE 77 05/11/2021 1339   GLUCOSE 138 (H) 07/10/2020 0444   BUN 20 05/11/2021 1339   CREATININE 0.73 05/11/2021 1339   CALCIUM 9.4 05/11/2021 1339   PROT 7.6 05/11/2021 1339   ALBUMIN 4.8 05/11/2021 1339   AST 19 05/11/2021 1339   ALT 24 05/11/2021 1339   ALKPHOS 98 05/11/2021 1339   BILITOT <0.2 05/11/2021 1339   GFRNONAA >60 07/10/2020 0444   GFRAA >60 11/27/2018 1957   No results found for: CHOL, HDL, LDLCALC, LDLDIRECT, TRIG, CHOLHDL Lab Results  Component Value Date   HGBA1C 5.4 07/07/2020       ASSESSMENT AND PLAN  Multiple sclerosis (HCC)  High risk medication use  Numbness  Gait disturbance  1.  Continue Ocrevus.  Around the time of the next visit, we will recheck an MRI of the brain to determine if there is any subclinical progression.  Additionally, we will check lab work including IgG/IgM and CBC with differential. 2.   Stay active exercise as tolerated. 3.   If dysesthesias worsen, consider gabapentin or other medication.  4.   Return in 6 months or sooner for new or worsening neurologic symptoms.    Edyn Popoca A. 07/09/2020, MD, [facility] 11/15/2021, 10:23 AM Certified in Neurology, Clinical Neurophysiology, Sleep Medicine and Neuroimaging  [facility] [address] [place], [place]  [place] [phone]

## 2021-11-29 DIAGNOSIS — B0052 Herpesviral keratitis: Secondary | ICD-10-CM | POA: Diagnosis not present

## 2021-12-07 ENCOUNTER — Telehealth: Payer: Self-pay | Admitting: Neurology

## 2021-12-07 NOTE — Telephone Encounter (Signed)
Called pt back. Advised I spoke w/ Dr. Epimenio Foot who would like to fit her in tomorrow at 8am for appt for evaluation. She agreed. I scheduled. Asked she check in by 745am, bring updated insurance cards and med list. Pt verbalized understanding.  ?

## 2021-12-07 NOTE — Telephone Encounter (Signed)
Pt called wanting to inform provider that this morning her vision in her L eye is completely blurred and would like to discuss. Please advise.  ?

## 2021-12-08 ENCOUNTER — Ambulatory Visit: Payer: BC Managed Care – PPO | Admitting: Neurology

## 2021-12-08 ENCOUNTER — Encounter: Payer: Self-pay | Admitting: Neurology

## 2021-12-08 VITALS — BP 135/85 | HR 80 | Ht 62.0 in | Wt 151.0 lb

## 2021-12-08 DIAGNOSIS — H469 Unspecified optic neuritis: Secondary | ICD-10-CM | POA: Diagnosis not present

## 2021-12-08 DIAGNOSIS — Z79899 Other long term (current) drug therapy: Secondary | ICD-10-CM | POA: Diagnosis not present

## 2021-12-08 DIAGNOSIS — R269 Unspecified abnormalities of gait and mobility: Secondary | ICD-10-CM

## 2021-12-08 DIAGNOSIS — G35 Multiple sclerosis: Secondary | ICD-10-CM

## 2021-12-08 NOTE — Progress Notes (Signed)
? ?[facility] ? ?PATIENT: Natalie Zimmerman ?DOB: 15-Aug-1991 ? ?REFERRING DOCTOR OR PCP: Ramiro Harvest, MD ?SOURCE: Patient, notes from recent hospitalization, imaging and lab reports, MRI images personally reviewed. ? ?_________________________________ ? ? ?HISTORICAL ? ?CHIEF COMPLAINT:  ?Chief Complaint  ?Patient presents with  ? Follow-up  ?  RM 2. Here for blurry vision. MS DMT: Ocrevus. Last infusion: 07/2021. Due in May for next infusion.  ? ? ?HISTORY OF PRESENT ILLNESS:  ?Natalie Zimmerman is a 31 y.o. 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 ? ?Update 12/08/2021 ?She was diagnosed with MS 2020 when about [redacted] weeks pregnant.  She switched form Copaxone to Ocrevus recently and tolerates it well.      ? ?She is on Ocrevus May 2022 and last one November 2022.   Last week, her left eye turned red, worse in sunlight.   She saw ophthalmology and was felt to have an infection so went on Abx eyedrops.  The redness resolved but she noted visual loss OS.   Colors are mildly saturated.    Acuity is mildly worse.   No scotomata/VF change.    She has not had any other neurologic symptom.    She denies diplopia (had in 2020).    She had eye pain worse with movements or blinking.     ? ?Gait, strengh, coordination and vision are fine.       Bladder function is fine.    She is experiencing numbness, tingling with some dysesthesia bilaterally, starting at night worse when she wakes up.   She also feels stiff upon awakening.    She feels better after she moves around.   If she wakes up (not most nights) she has troulbe falling back asleep due to the dysesthesias.    She does have foci at [facility] and C3C4, T1T2, T3, T3T4, T6 and T11T12.     She very rarely gets a Lhermitte sign (1/month)  ? ?She has some fatigue.   She notes generally more with her 32 month old.  Mood and cognition are doing well. ? ?She delivered her son 02/16/2021 .  She also has a son born in 2020.   She stopped  breastfeeding in January.   She is not planning on  more ? ?MS history: ?She had the onset of blurry vision when she looked to her left July 01, 2020.  This worsened over the next two days, over the weekend, especially looking at headlights while driving.   She saw ophthalmology 07/06/2020 and they suspected a neurologic disorder.   She was referred to the [facility] ED 07/06/2020.   MRIs of the brain and spine showed multiple lesions consistent with MS. Some of the foci enhanced after contrast consistent with more acute lesions in confirming the diagnosis. She was admitted and had 5 days of IV SOlu-Medrol.   Symptoms improved and she was discharged home.   While in the hospital, it was discovered that she was about 6 to [redacted] weeks pregnant.     Besides diplopia, she had no other symptoms at the time.   However, in the past, she did note some numbness or a hot sensation in her feet but never > 1 day.   However, she had intermittent Lhermitte signs since July 2021.     She denies current numbness, weakness or ataxia.   Gait is fine and stairs are not a problem without holding the bannister.  Bladder function is fine.Switched to [employer] 06/2021 due to breakthrough activity whil  on Copaxone    ? ?She has no FH of MS or autoimmune disease.    ? ?Imaging: ?MRI 05/10/2021 showed multiple T2/FLAIR hyperintense foci in the hemispheres in a pattern and configuration consistent with demyelinating plaques associated with multiple sclerosis.  Four of the foci enhance after contrast consistent with acute or subacute lesions.  Besides these foci, an additional focus in the deep white matter of the right frontal lobe was not present on the previous MRI ? ?MRI of the brain and orbits 07/06/2020 showed multiple T2/FLAIR hyperintense foci predominantly in the periventricular, juxtacortical and deep white matter of the hemispheres. Many of the periventricular foci are radially oriented to the ventricles.  At least 13 foci enhanced after contrast was administered.  The enhancing foci  were very small and would not be expected to be symptomatic.Marland Kitchen There were just a couple small nonenhancing nonenhancing foci in the brainstem and cerebellum, none in a position that would be expected to cause diplopia. Optic nerves were normal. ? ?MRI of the cervical and thoracic spine performed 07/07/2020 showed T2 hyperintense foci posteriorly at C2-C3 and towards the left at C3-C4, anteriorly at T1-T2, to the left at T3, to the right at T3-T4, centrally at T5, to the anterolateral right at T6 and centrally at T11-T12. Due to her pregnancy, the study was done without contrast. ? ?REVIEW OF SYSTEMS: ?Constitutional: No fevers, chills, sweats, or change in appetite ?Eyes: No visual changes, double vision, eye pain ?Ear, nose and throat: No hearing loss, ear pain, nasal congestion, sore throat ?Cardiovascular: No chest pain, palpitations ?Respiratory:  No shortness of breath at rest or with exertion.   No wheezes ?GastrointestinaI: No nausea, vomiting, diarrhea, abdominal pain, fecal incontinence ?Genitourinary:  No dysuria, urinary retention or frequency.  No nocturia. ?Musculoskeletal:  No neck pain, back pain ?Integumentary: No rash, pruritus, skin lesions ?Neurological: as above ?Psychiatric: No depression at this time.  No anxiety ?Endocrine: No palpitations, diaphoresis, change in appetite, change in weigh or increased thirst ?Hematologic/Lymphatic:  No anemia, purpura, petechiae. ?Allergic/Immunologic: No itchy/runny eyes, nasal congestion, recent allergic reactions, rashes ? ?ALLERGIES: ?No Known Allergies ? ?HOME MEDICATIONS: ? ?Current Outpatient Medications:  ?  Ocrelizumab (OCREVUS IV), Inject into the vein every 6 (six) months., Disp: , Rfl:  ? ?PAST MEDICAL HISTORY: ?Past Medical History:  ?Diagnosis Date  ? Medical history non-contributory   ? Multiple sclerosis (HCC) 2021  ? PCOS (polycystic ovarian syndrome)   ? Post partum depression 2019  ? Post-partum depression 2019  ? ? ?PAST SURGICAL  HISTORY: ?Past Surgical History:  ?Procedure Laterality Date  ? CESAREAN SECTION N/A 11/27/2018  ? Procedure: CESAREAN SECTION;  Surgeon: Myna Hidalgo, DO;  Location: [facility] LD ORS;  Service: Obstetrics;  Laterality: N/A;  ? ? ?FAMILY HISTORY: ?Family History  ?Problem Relation Age of Onset  ? Cancer Maternal Grandmother   ? Heart disease Maternal Grandfather   ? Cancer Paternal Grandfather   ? Cancer Maternal Aunt   ? Cancer Paternal Aunt   ? ? ?SOCIAL HISTORY: ? ?Social History  ? ?Socioeconomic History  ? Marital status: Single  ?  Spouse name: Not on file  ? Number of children: 1  ? Years of education: Not on file  ? Highest education level: Not on file  ?Occupational History  ? Occupation: [employer]  ?Tobacco Use  ? Smoking status: Never  ? Smokeless tobacco: Never  ?Vaping Use  ? Vaping Use: Never used  ?Substance and Sexual Activity  ? Alcohol use:  No  ?  Alcohol/week: 0.0 standard drinks  ? Drug use: No  ? Sexual activity: Yes  ?Other Topics Concern  ? Not on file  ?Social History Narrative  ? Lives with partner and son  ? Caffeine use: Coffee   ? Right handed  ? ?Social Determinants of Health  ? ?Financial Resource Strain: Not on file  ?Food Insecurity: Not on file  ?Transportation Needs: Not on file  ?Physical Activity: Not on file  ?Stress: Not on file  ?Social Connections: Not on file  ?Intimate Partner Violence: Not on file  ? ? ? ?PHYSICAL EXAM ? ?Vitals:  ? 12/08/21 0741  ?BP: 135/85  ?Pulse: 80  ?Weight: 151 lb (68.5 kg)  ?Height: 5\' 2"  (1.575 m)  ? ? ?Body mass index is 27.62 kg/m?. ? ?Vision Screening  ? Right eye Left eye Both eyes  ?Without correction     ?With correction 20/30 20/50 20/20   ? ? ? ?General: The patient is well-developed and well-nourished and in no acute distress ? ?HEENT:  Head is [place]/AT.  Sclera are anicteric and non-erythematous.    Normal fundoscopic exam ? ?Skin: Extremities are without rash or  edema. ? ?Neurologic Exam ? ?Mental status: The patient is alert and  oriented x 3 at the time of the examination. The patient has apparent normal recent and remote memory, with an apparently normal attention span and concentration ability.  speech is normal. ? ?Cranial nerves: Extraocular mo

## 2021-12-14 ENCOUNTER — Telehealth: Payer: Self-pay | Admitting: Neurology

## 2021-12-14 NOTE — Telephone Encounter (Signed)
MR Brain w/wo contrast Dr. Gershon Mussel Berkley Harvey: [id number] (exp. 12/09/21 to 01/07/22). Patient is scheduled at [facility] for 12/20/21.  ?

## 2021-12-20 ENCOUNTER — Ambulatory Visit: Payer: BC Managed Care – PPO

## 2021-12-20 DIAGNOSIS — G35 Multiple sclerosis: Secondary | ICD-10-CM | POA: Diagnosis not present

## 2021-12-20 MED ORDER — GADOBENATE DIMEGLUMINE 529 MG/ML IV SOLN
14.0000 mL | Freq: Once | INTRAVENOUS | Status: AC | PRN
  Administered 2021-12-20: 14 mL via INTRAVENOUS

## 2021-12-29 DIAGNOSIS — Z01419 Encounter for gynecological examination (general) (routine) without abnormal findings: Secondary | ICD-10-CM | POA: Diagnosis not present

## 2021-12-29 DIAGNOSIS — Z124 Encounter for screening for malignant neoplasm of cervix: Secondary | ICD-10-CM | POA: Diagnosis not present

## 2022-01-11 DIAGNOSIS — N871 Moderate cervical dysplasia: Secondary | ICD-10-CM | POA: Diagnosis not present

## 2022-01-11 DIAGNOSIS — Z3202 Encounter for pregnancy test, result negative: Secondary | ICD-10-CM | POA: Diagnosis not present

## 2022-01-18 DIAGNOSIS — N871 Moderate cervical dysplasia: Secondary | ICD-10-CM | POA: Diagnosis not present

## 2022-01-24 DIAGNOSIS — G35 Multiple sclerosis: Secondary | ICD-10-CM | POA: Diagnosis not present

## 2022-02-03 DIAGNOSIS — Z0000 Encounter for general adult medical examination without abnormal findings: Secondary | ICD-10-CM | POA: Diagnosis not present

## 2022-02-03 DIAGNOSIS — E282 Polycystic ovarian syndrome: Secondary | ICD-10-CM | POA: Diagnosis not present

## 2022-02-04 IMAGING — MR MR CERVICAL SPINE W/O CM
11 of 12 series · 37 of 48 positions shown · non-contrast
Comparison: None.

CLINICAL DATA: Multiple sclerosis.

EXAM:
MRI CERVICAL AND THORACIC SPINE WITHOUT CONTRAST
TECHNIQUE: Multiplanar and multiecho pulse sequences of the cervical spine, to
include the craniocervical junction and cervicothoracic junction,
and the thoracic spine, were obtained without intravenous contrast.

[Series 5: T1 · sagittal · 3.0mm · 0.69mm/px · 2 of 15 slices shown (1 of 4)]
[im 1/15]
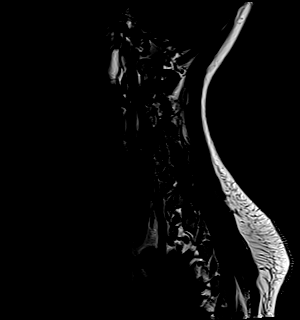
[im 15/15]
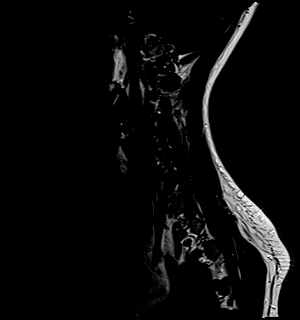

[Series 6: T2 · sagittal · 3.0mm · 0.69mm/px · 2 of 15 slices shown (1 of 4)]
[im 1/15]
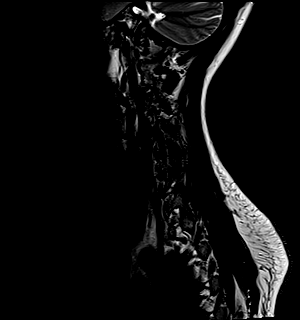
[im 15/15]
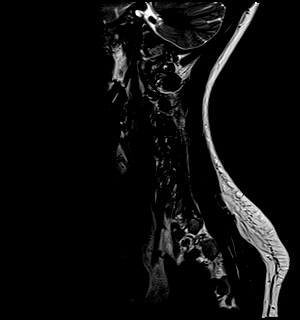

[Series 7: STIR · sagittal · 3.0mm · 0.86mm/px · 2 of 15 slices shown (1 of 2)]
[im 1/15]
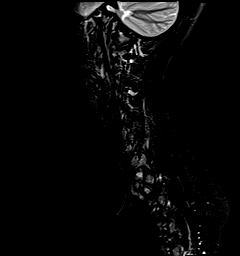
[im 15/15]
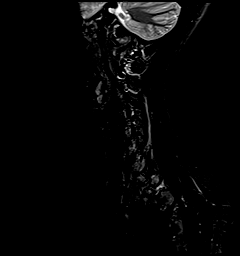

[Series 8: T2 · axial · 3.0mm · 0.70mm/px · z∈[-37,+62]mm · 6 of 31 slices shown (2 of 4)]
[im 1/31]
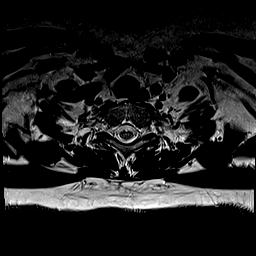
[im 7/31]
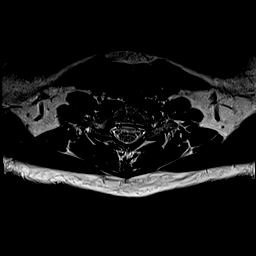
[im 13/31]
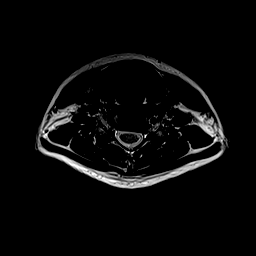
[im 19/31]
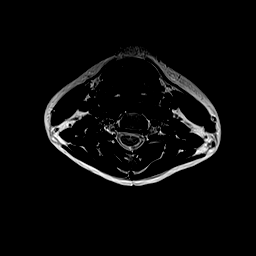
[im 25/31]
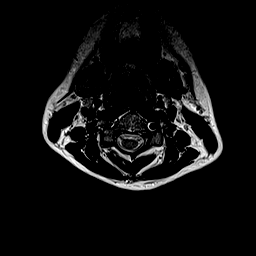
[im 31/31]
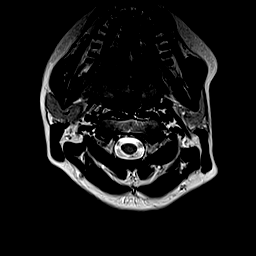

[Series 9: GRE · axial · 3.0mm · 0.35mm/px · z∈[-37,-17]mm · 2 of 31 slices shown]
[im 1/31]
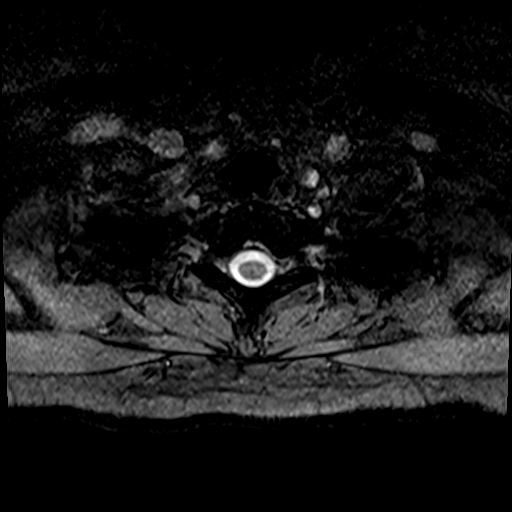
[im 7/31]
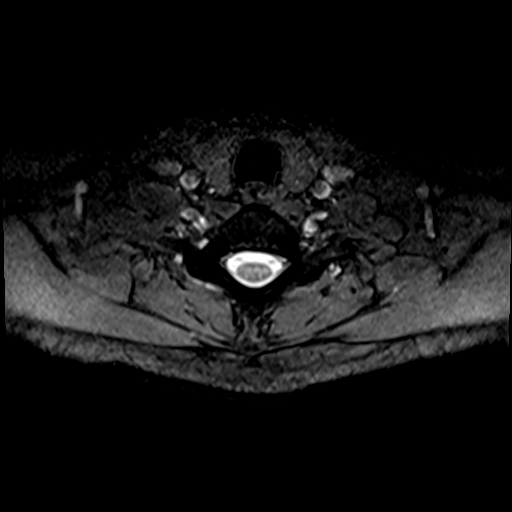

[Series 10: T1 · axial · 3.0mm · 0.35mm/px · z∈[-37,+62]mm · 6 of 31 slices shown (2 of 4)]
[im 1/31]
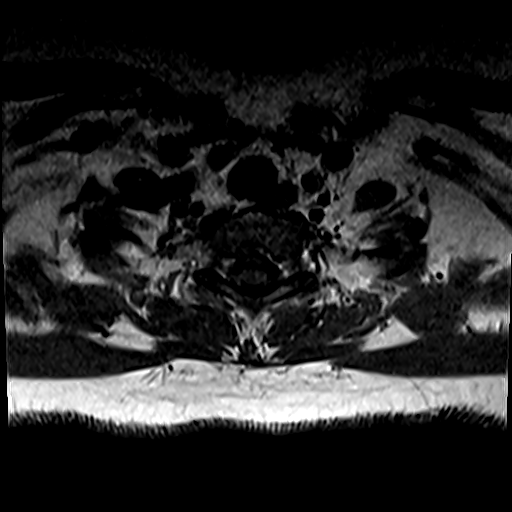
[im 7/31]
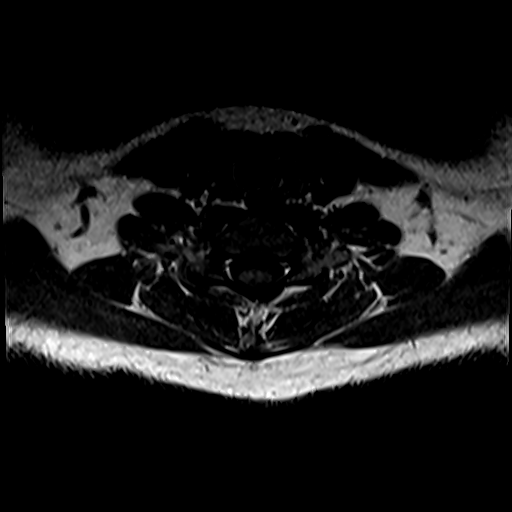
[im 13/31]
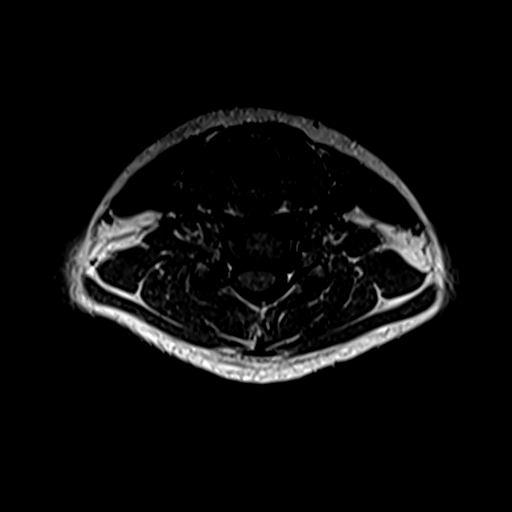
[im 19/31]
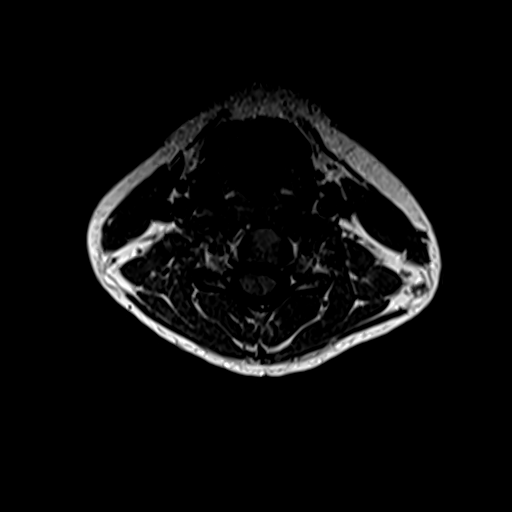
[im 25/31]
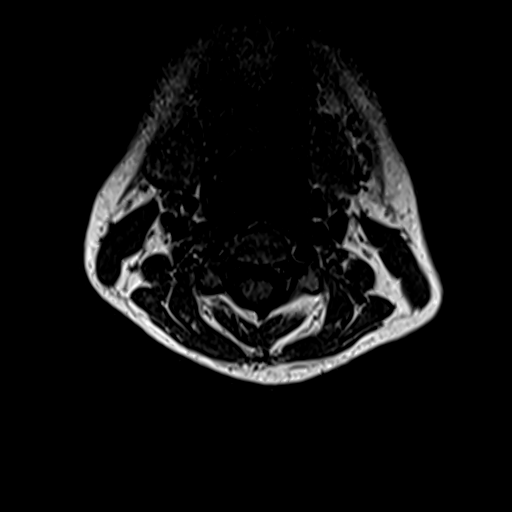
[im 31/31]
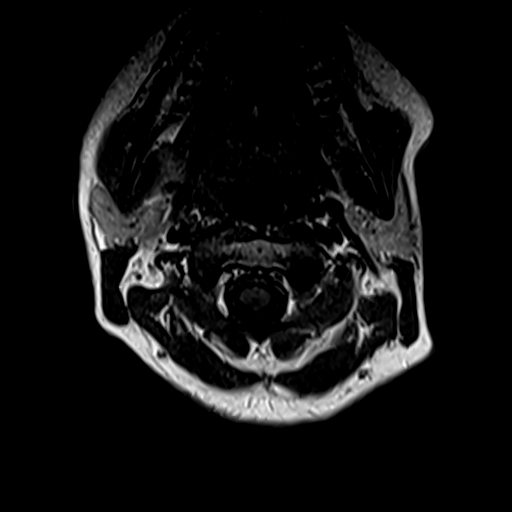

[Series 26: T1 · sagittal · 4.0mm · 1.72mm/px · 1 of 5 slices shown (3 of 4)]
[im 1/5]
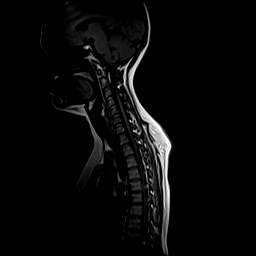

[Series 27: STIR · sagittal · 3.0mm · 1.00mm/px · 3 of 15 slices shown (2 of 2)]
[im 1/15]
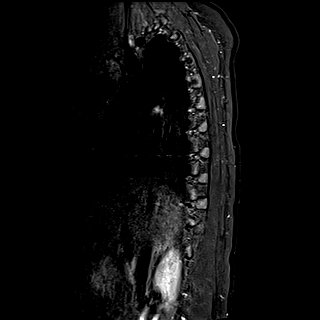
[im 8/15]
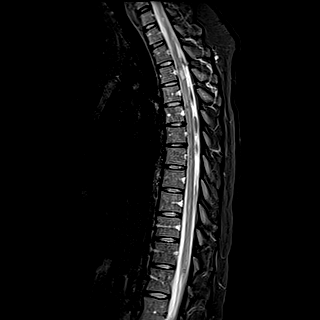
[im 15/15]
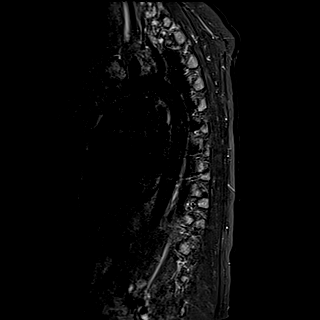

[Series 28: T1 · sagittal · 3.0mm · 1.00mm/px · 3 of 15 slices shown (4 of 4)]
[im 1/15]
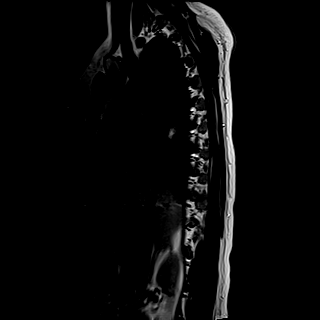
[im 8/15]
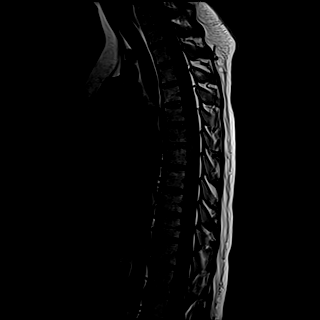
[im 15/15]
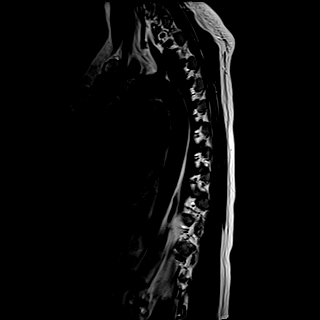

[Series 29: T2 · sagittal · 3.0mm · 0.83mm/px · 3 of 15 slices shown (3 of 4)]
[im 1/15]
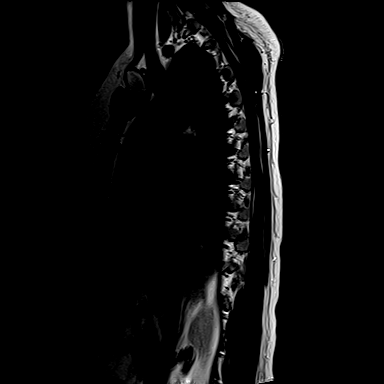
[im 8/15]
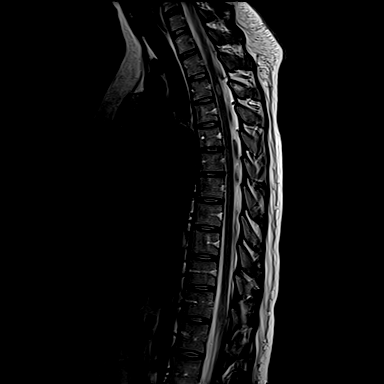
[im 15/15]
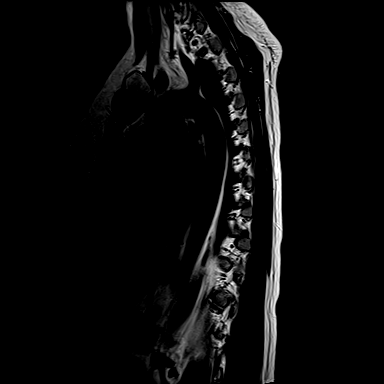

[Series 30: T2 · axial · 4.0mm · 0.78mm/px · z∈[-256,-26]mm · 7 of 39 slices shown (4 of 4)]
[im 1/39]
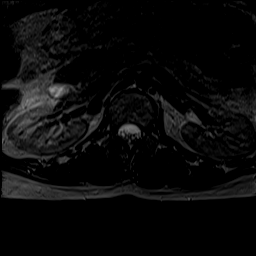
[im 7/39]
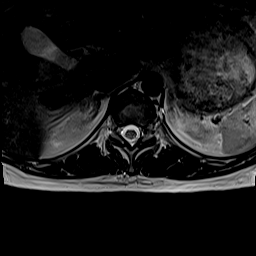
[im 13/39]
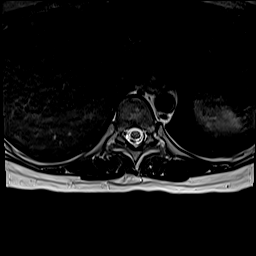
[im 20/39]
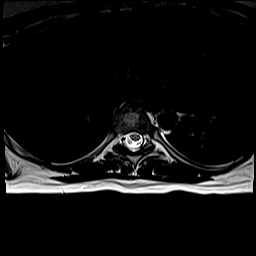
[im 26/39]
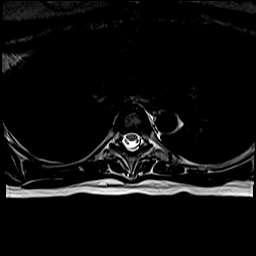
[im 32/39]
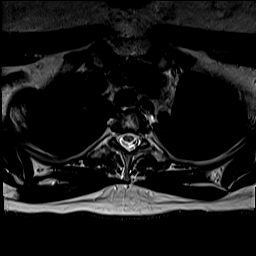
[im 39/39]
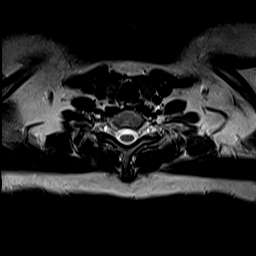

[37 of 48 positions shown; findings below may reference images not displayed]

FINDINGS: MRI CERVICAL SPINE FINDINGS

Alignment: Straightening of the cervical curvature.

Vertebrae: No fracture, evidence of discitis, or bone lesion.

Cord: Foci of subtle increased T2 signal within the cervical cord at
the C2-3 level posteriorly and C3-4 level on the left.

Posterior Fossa, vertebral arteries, paraspinal tissues: Negative.

Disc levels:

No significant disc protrusion, spinal canal or neural foraminal
stenosis at any cervical level.

MRI THORACIC SPINE FINDINGS

Alignment:  Physiologic.

Vertebrae: No fracture, evidence of discitis, or bone lesion.

Cord: T2 hyperintense lesions are seen at the T1-2 level anteriorly,
at T3 on the left, T3-4 on the right, T6 on the right and anteriorly
at its T11-12.

Paraspinal and other soft tissues: Negative.

Disc levels:

No significant disc herniation, spinal canal or neural foraminal
stenosis at any thoracic level.
IMPRESSION: 1. Multiple foci of increased T2 signal within the cervical and
thoracic cord consistent with the clinical diagnosis of multiple
sclerosis.
2. No significant spinal canal or neural foraminal stenosis at any
cervical or thoracic level.

## 2022-02-15 DIAGNOSIS — N871 Moderate cervical dysplasia: Secondary | ICD-10-CM | POA: Diagnosis not present

## 2022-02-15 DIAGNOSIS — Z3202 Encounter for pregnancy test, result negative: Secondary | ICD-10-CM | POA: Diagnosis not present

## 2022-03-13 DIAGNOSIS — N871 Moderate cervical dysplasia: Secondary | ICD-10-CM | POA: Diagnosis not present

## 2022-05-25 ENCOUNTER — Ambulatory Visit: Payer: BC Managed Care – PPO | Admitting: Neurology

## 2022-05-25 ENCOUNTER — Encounter: Payer: Self-pay | Admitting: Neurology

## 2022-05-25 VITALS — BP 128/82 | HR 74 | Ht 62.0 in | Wt 153.5 lb

## 2022-05-25 DIAGNOSIS — G35 Multiple sclerosis: Secondary | ICD-10-CM | POA: Diagnosis not present

## 2022-05-25 DIAGNOSIS — Z79899 Other long term (current) drug therapy: Secondary | ICD-10-CM | POA: Diagnosis not present

## 2022-05-25 DIAGNOSIS — H539 Unspecified visual disturbance: Secondary | ICD-10-CM

## 2022-05-25 NOTE — Progress Notes (Signed)
[facility]  PATIENT: Natalie Zimmerman DOB: 08/14/1991  REFERRING DOCTOR OR PCP: Ramiro Harvest, MD SOURCE: Patient, notes from recent hospitalization, imaging and lab reports, MRI images personally reviewed.  _________________________________   HISTORICAL  CHIEF COMPLAINT:  Chief Complaint  Patient presents with   Follow-up    RM 2, alone. Last seen 12/08/21. MS DMT: Ocrevus. Tolerating infusion well, no SE. Reports blurry vision, red eyes/pain 4 days ago that lasted 2 days. This has resolved. She did not take any medicine for it.     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  Update 12/08/2021 She was diagnosed with MS 2020 when about [redacted] weeks pregnant.  She switched form Copaxone to Ocrevus recently and tolerates it well.       She had an episode of L>R visual change lasting 2 days.  Vision was blurry and blinking hurt more than other eye movements which hurt a bit.  The left eye was red.  She has had soe HA but not necessarily associated with the redness in her eye.     It improved.  She had a similar even earlier this year an saw ophthalmology and they placed her on Abx drops     She is on Ocrevus since May 2022 showed breakthrough activity with 4-5 new .  lesions/enhancement.,     She had been on glatiramer  Last infusion was Ma y 2023.     The redness resolved but she noted visual loss OS.   Colors are mildly saturated.    Acuity is mildly worse.   No scotomata/VF change.    She has not had any other neurologic symptom.    She denies diplopia (had in 2020).    She had eye pain worse with movements or blinking.      Gait, strengh, coordination and vision are fine.       Bladder function is fine.    She is experiencing numbness, tingling with some dysesthesia bilaterally, starting at night worse when she wakes up.   She also feels stiff upon awakening.    She feels better after she moves around.   If she wakes up (not most  nights) she has troulbe falling back asleep due to the dysesthesias.    She does have foci at [facility] and C3C4, T1T2, T3, T3T4, T6 and T11T12.     She very rarely gets a Lhermitte sign (1/month)   She has some fatigue.   She notes generally more with her 51 month old.  Mood and cognition are doing well.  She delivered her son 02/16/2021 .  She also has a son born in 2020.   She stopped  breastfeeding in January.   She is not planning on more                                 MS history: She had the onset of blurry vision when she looked to her left July 01, 2020.  This worsened over the next two days, over the weekend, especially looking at headlights while driving.   She saw ophthalmology 07/06/2020 and they suspected a neurologic disorder.   She was referred to the [facility] ED 07/06/2020.   MRIs of the brain and spine showed multiple lesions consistent with MS. Some of the foci enhanced after contrast consistent with more acute lesions in confirming the diagnosis.  She was admitted and had 5 days of IV SOlu-Medrol.   Symptoms improved and she was discharged home.   While in the hospital, it was discovered that she was about 6 to [redacted] weeks pregnant.     Besides diplopia, she had no other symptoms at the time.   However, in the past, she did note some numbness or a hot sensation in her feet but never > 1 day.   However, she had intermittent Lhermitte signs since July 2021.     She denies current numbness, weakness or ataxia.   Gait is fine and stairs are not a problem without holding the bannister.  Bladder function is fine.Switched to [employer] 06/2021 due to breakthrough activity whil on Copaxone     She has no FH of MS or autoimmune disease.      Imaging: MRI 12/20/2021 showed  Multiple T2/FLAIR hyperintense foci in the hemispheres in a pattern consistent with chronic demyelinating plaque associated with multiple sclerosis.  None of the foci enhanced or appeared to be acute.  Compared to the MRI from  05/10/2021, there are no new lesions.  Additionally, the abnormal enhancement of some of the foci in 2022 has resolved.     No acute findings.  Normal enhancement pattern.  MRI 05/10/2021 showed multiple T2/FLAIR hyperintense foci in the hemispheres in a pattern and configuration consistent with demyelinating plaques associated with multiple sclerosis.  Four of the foci enhance after contrast consistent with acute or subacute lesions.  Besides these foci, an additional focus in the deep white matter of the right frontal lobe was not present on the previous MRI  MRI of the brain and orbits 07/06/2020 showed multiple T2/FLAIR hyperintense foci predominantly in the periventricular, juxtacortical and deep white matter of the hemispheres. Many of the periventricular foci are radially oriented to the ventricles.  At least 13 foci enhanced after contrast was administered.  The enhancing foci were very small and would not be expected to be symptomatic.Marland Kitchen There were just a couple small nonenhancing nonenhancing foci in the brainstem and cerebellum, none in a position that would be expected to cause diplopia. Optic nerves were normal.  MRI of the cervical and thoracic spine performed 07/07/2020 showed T2 hyperintense foci posteriorly at C2-C3 and towards the left at C3-C4, anteriorly at T1-T2, to the left at T3, to the right at T3-T4, centrally at T5, to the anterolateral right at T6 and centrally at T11-T12. Due to her pregnancy, the study was done without contrast.  REVIEW OF SYSTEMS: Constitutional: No fevers, chills, sweats, or change in appetite Eyes: No visual changes, double vision, eye pain Ear, nose and throat: No hearing loss, ear pain, nasal congestion, sore throat Cardiovascular: No chest pain, palpitations Respiratory:  No shortness of breath at rest or with exertion.   No wheezes GastrointestinaI: No nausea, vomiting, diarrhea, abdominal pain, fecal incontinence Genitourinary:  No dysuria, urinary  retention or frequency.  No nocturia. Musculoskeletal:  No neck pain, back pain Integumentary: No rash, pruritus, skin lesions Neurological: as above Psychiatric: No depression at this time.  No anxiety Endocrine: No palpitations, diaphoresis, change in appetite, change in weigh or increased thirst Hematologic/Lymphatic:  No anemia, purpura, petechiae. Allergic/Immunologic: No itchy/runny eyes, nasal congestion, recent allergic reactions, rashes  ALLERGIES: No Known Allergies  HOME MEDICATIONS:  Current Outpatient Medications:    Ocrelizumab (OCREVUS IV), Inject into the vein every 6 (six) months., Disp: , Rfl:   PAST MEDICAL HISTORY: Past Medical History:  Diagnosis Date   Medical  history non-contributory    Multiple sclerosis (HCC) 2021   PCOS (polycystic ovarian syndrome)    Post partum depression 2019   Post-partum depression 2019    PAST SURGICAL HISTORY: Past Surgical History:  Procedure Laterality Date   CESAREAN SECTION N/A 11/27/2018   Procedure: CESAREAN SECTION;  Surgeon: Myna Hidalgo, DO;  Location: [facility] LD ORS;  Service: Obstetrics;  Laterality: N/A;    FAMILY HISTORY: Family History  Problem Relation Age of Onset   Cancer Maternal Grandmother    Heart disease Maternal Grandfather    Cancer Paternal Grandfather    Cancer Maternal Aunt    Cancer Paternal Aunt     SOCIAL HISTORY:  Social History   Socioeconomic History   Marital status: Single    Spouse name: Not on file   Number of children: 1   Years of education: Not on file   Highest education level: Not on file  Occupational History   Occupation: [employer]  Tobacco Use   Smoking status: Never   Smokeless tobacco: Never  Vaping Use   Vaping Use: Never used  Substance and Sexual Activity   Alcohol use: No    Alcohol/week: 0.0 standard drinks of alcohol   Drug use: No   Sexual activity: Yes  Other Topics Concern   Not on file  Social History Narrative   Lives with partner and  son   Caffeine use: Coffee    Right handed   Social Determinants of Health   Financial Resource Strain: Not on file  Food Insecurity: Not on file  Transportation Needs: Not on file  Physical Activity: Not on file  Stress: Not on file  Social Connections: Not on file  Intimate Partner Violence: Not on file     PHYSICAL EXAM  Vitals:   05/25/22 0943  BP: 128/82  Pulse: 74  Weight: 153 lb 8 oz (69.6 kg)  Height: 5\' 2"  (1.575 m)    Body mass index is 28.08 kg/m.  No results found.    General: The patient is well-developed and well-nourished and in no acute distress  HEENT:  Head is [place]/AT.  Sclera are anicteric and non-erythematous.    Normal fundoscopic exam  Skin: Extremities are without rash or  edema.  Neurologic Exam  Mental status: The patient is alert and oriented x 3 at the time of the examination. The patient has apparent normal recent and remote memory, with an apparently normal attention span and concentration ability.  speech is normal.  Cranial nerves: Extraocular movements are full.  VA as above. Colors now symmetric .  Facial strength and sensation were normal.    Hearing was normal.  Motor:  Muscle bulk is normal.   Tone is normal. Strength is  5 / 5 in all 4 extremities.   Sensory: Sensory testing is intact to pinprick, soft touch and vibration sensation in all 4 extremities.  Coordination: Cerebellar testing reveals good finger-nose-finger and heel-to-shin bilaterally.  Gait and station: Station is normal.   Gait is normal. Tandem gait is normal today.     Romberg is negative.   Reflexes: Deep tendon reflexes were normal.     DIAGNOSTIC DATA (LABS, IMAGING, TESTING) - I reviewed patient records, labs, notes, testing and imaging myself where available.  Lab Results  Component Value Date   WBC 7.7 05/11/2021   HGB 12.2 05/11/2021   HCT 37.8 05/11/2021   MCV 85 05/11/2021   PLT 332 05/11/2021      Component Value Date/Time  NA 144  05/11/2021 1339   K 4.4 05/11/2021 1339   CL 106 05/11/2021 1339   CO2 24 05/11/2021 1339   GLUCOSE 77 05/11/2021 1339   GLUCOSE 138 (H) 07/10/2020 0444   BUN 20 05/11/2021 1339   CREATININE 0.73 05/11/2021 1339   CALCIUM 9.4 05/11/2021 1339   PROT 7.6 05/11/2021 1339   ALBUMIN 4.8 05/11/2021 1339   AST 19 05/11/2021 1339   ALT 24 05/11/2021 1339   ALKPHOS 98 05/11/2021 1339   BILITOT <0.2 05/11/2021 1339   GFRNONAA >60 07/10/2020 0444   GFRAA >60 11/27/2018 1957   No results found for: "CHOL", "HDL", "LDLCALC", "LDLDIRECT", "TRIG", "CHOLHDL" Lab Results  Component Value Date   HGBA1C 5.4 07/07/2020       ASSESSMENT AND PLAN  Multiple sclerosis (HCC) - Plan: CBC with Differential/Platelet, IgG, IgA, IgM, CD20 B Cells  High risk medication use - Plan: CBC with Differential/Platelet, IgG, IgA, IgM, CD20 B Cells  Vision disturbance  1.   Continue Ocrevus and check labs,   At next visit, we will check an MRI of the brain to determine if there is also any subclinical progression.   If so, consider a different DMT.   2.   Stay active exercise as tolerated. 3.   Return in 6 months or sooner for new or worsening neurologic symptoms.    Irem Stoneham A. Epimenio Foot, MD, [facility] 05/25/2022, 10:20 AM Certified in Neurology, Clinical Neurophysiology, Sleep Medicine and Neuroimaging  [facility] [address], [address] [place], [place] [address] [phone]

## 2022-05-26 LAB — CBC WITH DIFFERENTIAL/PLATELET
Basophils Absolute: 0.1 10*3/uL (ref 0.0–0.2)
Basos: 1 %
EOS (ABSOLUTE): 0.2 10*3/uL (ref 0.0–0.4)
Eos: 3 %
Hematocrit: 41 % (ref 34.0–46.6)
Hemoglobin: 13.2 g/dL (ref 11.1–15.9)
Immature Grans (Abs): 0 10*3/uL (ref 0.0–0.1)
Immature Granulocytes: 0 %
Lymphocytes Absolute: 1.8 10*3/uL (ref 0.7–3.1)
Lymphs: 29 %
MCH: 29.1 pg (ref 26.6–33.0)
MCHC: 32.2 g/dL (ref 31.5–35.7)
MCV: 91 fL (ref 79–97)
Monocytes Absolute: 0.4 10*3/uL (ref 0.1–0.9)
Monocytes: 7 %
Neutrophils Absolute: 3.7 10*3/uL (ref 1.4–7.0)
Neutrophils: 60 %
Platelets: 298 10*3/uL (ref 150–450)
RBC: 4.53 x10E6/uL (ref 3.77–5.28)
RDW: 13.5 % (ref 11.7–15.4)
WBC: 6.1 10*3/uL (ref 3.4–10.8)

## 2022-05-26 LAB — IGG, IGA, IGM
IgA/Immunoglobulin A, Serum: 341 mg/dL (ref 87–352)
IgG (Immunoglobin G), Serum: 979 mg/dL (ref 586–1602)
IgM (Immunoglobulin M), Srm: 49 mg/dL (ref 26–217)

## 2022-05-26 LAB — CD20 B CELLS
% CD19-B Cells: 0 % — ABNORMAL LOW (ref 4.6–22.1)
% CD20-B Cells: 0 % — ABNORMAL LOW (ref 5.0–22.3)

## 2022-06-28 ENCOUNTER — Encounter: Payer: Self-pay | Admitting: Neurology

## 2022-06-28 ENCOUNTER — Telehealth: Payer: Self-pay | Admitting: Neurology

## 2022-06-28 NOTE — Telephone Encounter (Signed)
Pt called wanting to inform the provider that she has had a headache for about a month and now it is starting to go down her neck. Pt states she also is having blurry vision once in a while. Pt had to cx her Infusion that was scheduled for Nov 14th due to her switching jobs and her insurance not being active. Please advise.

## 2022-06-28 NOTE — Telephone Encounter (Signed)
Called the patient back. She describes having a headache that has been off/on  for a month. She states that she takes ibuprofen and it will help with knocking the headache out sometimes. She states that she has noticed blurry vision as well but this is also intermittently coming and going. This is nothing like it had been before.   She mainly wanted to let Dr Felecia Shelling know this was going on and that for the month of November she will miss her ocrevus dose because of lack of insurance coverage. Pt has already spoke to [place] in  infusion and will update her with the new insurance information as soon as she gets it so they can start authorization process.

## 2022-06-28 NOTE — Telephone Encounter (Signed)
I have forwarded a mychart message to the patient advising that Dr Felecia Shelling will call in a steroid dose pack for the headaches if she would like and he is ok with her going over a month past the ocrevus. Will await her response

## 2022-06-29 MED ORDER — METHYLPREDNISOLONE 4 MG PO TBPK: ORAL_TABLET | ORAL | 0 refills | Status: DC

## 2022-09-20 ENCOUNTER — Telehealth: Payer: Self-pay | Admitting: *Deleted

## 2022-09-20 NOTE — Patient Outreach (Signed)
  Care Coordination   09/20/2022 Name: Natalie Zimmerman MRN: [id number] DOB: Mar 01, 1991   Care Coordination Outreach Attempts:  An unsuccessful telephone outreach was attempted today to offer the patient information about available care coordination services as a benefit of their health plan.   Follow Up Plan:  Additional outreach attempts will be made to offer the patient care coordination information and services.   Encounter Outcome:  No Answer   Care Coordination Interventions:  No, not indicated    Raina Mina, RN Care Management Coordinator [place] Office [phone]

## 2022-11-21 NOTE — Progress Notes (Unsigned)
No chief complaint on file.   HISTORY OF PRESENT ILLNESS:  11/21/22 ALL:  Natalie Zimmerman is a 32 y.o. female here today for follow up for RRMS. She continues Ocrevus infusions. Labs were stable 05/2022. MRI brain stable 12/2021.   Gait? Diplopia?  Mood? Sleep?  HISTORY (copied from Dr Garth Bigness previous note)  Natalie Zimmerman is a 32 y.o. 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   Update 12/08/2021 She was diagnosed with MS 2020 when about [redacted] weeks pregnant.  She switched form Copaxone to Ocrevus recently and tolerates it well.        She had an episode of L>R visual change lasting 2 days.  Vision was blurry and blinking hurt more than other eye movements which hurt a bit.  The left eye was red.  She has had soe HA but not necessarily associated with the redness in her eye.     It improved.  She had a similar even earlier this year an saw ophthalmology and they placed her on Abx drops      She is on Ocrevus since May 2022 showed breakthrough activity with 4-5 new .  lesions/enhancement.,     She had been on glatiramer  Last infusion was Ma y 2023.     The redness resolved but she noted visual loss OS.   Colors are mildly saturated.    Acuity is mildly worse.   No scotomata/VF change.    She has not had any other neurologic symptom.    She denies diplopia (had in 2020).    She had eye pain worse with movements or blinking.       Gait, strengh, coordination and vision are fine.       Bladder function is fine.    She is experiencing numbness, tingling with some dysesthesia bilaterally, starting at night worse when she wakes up.   She also feels stiff upon awakening.    She feels better after she moves around.   If she wakes up (not most nights) she has troulbe falling back asleep due to the dysesthesias.    She does have foci at [facility] and C3C4, T1T2, T3, T3T4, T6 and T11T12.     She very rarely gets a Lhermitte sign (1/month)    She has some fatigue.   She notes generally more with her 48 month old.  Mood and  cognition are doing well.   She delivered her son 02/16/2021 .  She also has a son born in 2020.   She stopped  breastfeeding in January.   She is not planning on more  MS history: She had the onset of blurry vision when she looked to her left July 01, 2020.  This worsened over the next two days, over the weekend, especially looking at headlights while driving.   She saw ophthalmology 07/06/2020 and they suspected a neurologic disorder.   She was referred to the [facility] ED 07/06/2020.   MRIs of the brain and spine showed multiple lesions consistent with MS. Some of the foci enhanced after contrast consistent with more acute lesions in confirming the diagnosis. She was admitted and had 5 days of IV SOlu-Medrol.   Symptoms improved and she was discharged home.   While in the hospital, it was discovered that she was about 6 to [redacted] weeks pregnant.     Besides diplopia, she had no other symptoms at the time.   However, in the past, she did note some numbness or a hot sensation in her feet but never > 1 day.   However, she had intermittent Lhermitte signs since July 2021.     She denies current numbness, weakness or ataxia.   Gait is fine and stairs are not a problem without holding the bannister.  Bladder function is fine.Switched to [employer] 06/2021 due to breakthrough activity whil on Copaxone      She has no FH of MS or autoimmune disease.        Imaging: MRI 12/20/2021 showed  Multiple T2/FLAIR hyperintense foci in the hemispheres in a pattern consistent with chronic demyelinating plaque associated with multiple sclerosis.  None of the foci enhanced or appeared to be acute.  Compared to the MRI from 05/10/2021, there  are no new lesions.  Additionally, the abnormal enhancement of some of the foci in 2022 has resolved.     No acute findings.  Normal enhancement pattern.   MRI 05/10/2021 showed multiple T2/FLAIR hyperintense foci in the hemispheres in a pattern and configuration consistent with demyelinating plaques associated with multiple sclerosis.  Four of the foci enhance after contrast consistent with acute or subacute lesions.  Besides these foci, an additional focus in the deep white matter of the right frontal lobe was not present on the previous MRI   MRI of the brain and orbits 07/06/2020 showed multiple T2/FLAIR hyperintense foci predominantly in the periventricular, juxtacortical and deep white matter of the hemispheres. Many of the periventricular foci are radially oriented to the ventricles.  At least 13 foci enhanced after contrast was administered.  The enhancing foci were very small and would not be expected to be symptomatic.Marland Kitchen There were just a couple small nonenhancing nonenhancing foci in the brainstem and cerebellum, none in a position that would be expected to cause diplopia. Optic nerves were normal.   MRI of the cervical and thoracic spine performed 07/07/2020 showed T2 hyperintense foci posteriorly at C2-C3 and towards the left at C3-C4, anteriorly at T1-T2, to the left at T3, to the right at T3-T4, centrally at T5, to the anterolateral right at T6 and centrally at T11-T12. Due to her pregnancy, the study was done without contrast.   REVIEW OF SYSTEMS: Out of a complete 14 system review of symptoms, the patient complains only of the following symptoms, and all other reviewed systems are negative.   ALLERGIES: No Known Allergies   HOME MEDICATIONS: Outpatient Medications Prior to Visit  Medication Sig Dispense Refill   methylPREDNISolone (MEDROL DOSEPAK) 4 MG TBPK tablet Take as directed 21 tablet 0   Ocrelizumab (OCREVUS IV) Inject into the vein every 6 (six) months.  No  facility-administered medications prior to visit.     PAST MEDICAL HISTORY: Past Medical History:  Diagnosis Date   Medical history non-contributory    Multiple sclerosis ([place]) 2021   PCOS (polycystic ovarian syndrome)    Post partum depression 2019   Post-partum depression 2019     PAST SURGICAL HISTORY: Past Surgical History:  Procedure Laterality Date   CESAREAN SECTION N/A 11/27/2018   Procedure: CESAREAN SECTION;  Surgeon: Janyth Pupa, DO;  Location: [place] LD ORS;  Service: Obstetrics;  Laterality: N/A;     FAMILY HISTORY: Family History  Problem Relation Age of Onset   Cancer Maternal Grandmother    Heart disease Maternal Grandfather    Cancer Paternal Grandfather    Cancer Maternal Aunt    Cancer Paternal Aunt      SOCIAL HISTORY: Social History   Socioeconomic History   Marital status: Single    Spouse name: Not on file   Number of children: 1   Years of education: Not on file   Highest education level: Not on file  Occupational History   Occupation: [employer]  Tobacco Use   Smoking status: Never   Smokeless tobacco: Never  Vaping Use   Vaping Use: Never used  Substance and Sexual Activity   Alcohol use: No    Alcohol/week: 0.0 standard drinks of alcohol   Drug use: No   Sexual activity: Yes  Other Topics Concern   Not on file  Social History Narrative   Lives with partner and son   Caffeine use: Coffee    Right handed   Social Determinants of Health   Financial Resource Strain: Not on file  Food Insecurity: Not on file  Transportation Needs: Not on file  Physical Activity: Not on file  Stress: Not on file  Social Connections: Not on file  Intimate Partner Violence: Not on file     PHYSICAL EXAM  There were no vitals filed for this visit. There is no height or weight on file to calculate BMI.  Generalized: Well developed, in no acute distress  Cardiology: normal rate and rhythm, no murmur auscultated  Respiratory:  clear to auscultation bilaterally    Neurological examination  Mentation: Alert oriented to time, place, history taking. Follows all commands speech and language fluent Cranial nerve II-XII: Pupils were equal round reactive to light. Extraocular movements were full, visual field were full on confrontational test. Facial sensation and strength were normal. Uvula tongue midline. Head turning and shoulder shrug  were normal and symmetric. Motor: The motor testing reveals 5 over 5 strength of all 4 extremities. Good symmetric motor tone is noted throughout.  Sensory: Sensory testing is intact to soft touch on all 4 extremities. No evidence of extinction is noted.  Coordination: Cerebellar testing reveals good finger-nose-finger and heel-to-shin bilaterally.  Gait and station: Gait is normal. Tandem gait is normal. Romberg is negative. No drift is seen.  Reflexes: Deep tendon reflexes are symmetric and normal bilaterally.    DIAGNOSTIC DATA (LABS, IMAGING, TESTING) - I reviewed patient records, labs, notes, testing and imaging myself where available.  Lab Results  Component Value Date   WBC 6.1 05/25/2022   HGB 13.2 05/25/2022   HCT 41.0 05/25/2022   MCV 91 05/25/2022   PLT 298 05/25/2022      Component Value Date/Time   NA 144 05/11/2021 1339   K 4.4 05/11/2021 1339   CL 106 05/11/2021 1339   CO2 24 05/11/2021 1339   GLUCOSE 77 05/11/2021  1339   GLUCOSE 138 (H) 07/10/2020 0444   BUN 20 05/11/2021 1339   CREATININE 0.73 05/11/2021 1339   CALCIUM 9.4 05/11/2021 1339   PROT 7.6 05/11/2021 1339   ALBUMIN 4.8 05/11/2021 1339   AST 19 05/11/2021 1339   ALT 24 05/11/2021 1339   ALKPHOS 98 05/11/2021 1339   BILITOT <0.2 05/11/2021 1339   GFRNONAA >60 07/10/2020 0444   GFRAA >60 11/27/2018 1957   No results found for: "CHOL", "HDL", "[place]", "LDLDIRECT", "TRIG", "CHOLHDL" Lab Results  Component Value Date   HGBA1C 5.4 07/07/2020   Lab Results  Component Value Date   VITAMINB12  417 07/15/2020   No results found for: "TSH"      No data to display               No data to display           ASSESSMENT AND PLAN  32 y.o. year old female  has a past medical history of Medical history non-contributory, Multiple sclerosis ([place]) (2021), PCOS (polycystic ovarian syndrome), Post partum depression (2019), and Post-partum depression (2019). here with    No diagnosis found.  Sunday Spillers ***.  Healthy lifestyle habits encouraged. *** will follow up with PCP as directed. *** will return to see me in ***, sooner if needed. *** verbalizes understanding and agreement with this plan.   No orders of the defined types were placed in this encounter.    No orders of the defined types were placed in this encounter.    Debbora Presto, MSN, FNP-C 11/21/2022, 12:53 PM  [facility] [address], [place], [place] [address] [phone]

## 2022-11-21 NOTE — Patient Instructions (Signed)
Below is our plan:  We will continue   Please make sure you are staying well hydrated. I recommend 50-60 ounces daily. Well balanced diet and regular exercise encouraged. Consistent sleep schedule with 6-8 hours recommended.   Please continue follow up with care team as directed.   Follow up with me in 6 months   You may receive a survey regarding today's visit. I encourage you to leave honest feed back as I do use this information to improve patient care. Thank you for seeing me today!

## 2022-11-23 ENCOUNTER — Encounter: Payer: Self-pay | Admitting: Family Medicine

## 2022-11-23 ENCOUNTER — Ambulatory Visit: Payer: 59 | Admitting: Family Medicine

## 2022-11-23 ENCOUNTER — Ambulatory Visit: Payer: BC Managed Care – PPO | Admitting: Family Medicine

## 2022-11-23 VITALS — BP 131/87 | HR 77 | Ht 62.0 in | Wt 150.5 lb

## 2022-11-23 DIAGNOSIS — R269 Unspecified abnormalities of gait and mobility: Secondary | ICD-10-CM | POA: Diagnosis not present

## 2022-11-23 DIAGNOSIS — Z79899 Other long term (current) drug therapy: Secondary | ICD-10-CM | POA: Diagnosis not present

## 2022-11-23 DIAGNOSIS — G35 Multiple sclerosis: Secondary | ICD-10-CM

## 2022-11-23 DIAGNOSIS — H532 Diplopia: Secondary | ICD-10-CM

## 2022-12-07 ENCOUNTER — Telehealth: Payer: Self-pay | Admitting: Neurology

## 2022-12-07 ENCOUNTER — Ambulatory Visit: Payer: 59 | Admitting: Neurology

## 2022-12-07 ENCOUNTER — Encounter: Payer: Self-pay | Admitting: Family Medicine

## 2022-12-07 ENCOUNTER — Encounter: Payer: Self-pay | Admitting: Neurology

## 2022-12-07 VITALS — BP 135/93 | HR 73 | Ht 62.0 in | Wt 153.0 lb

## 2022-12-07 DIAGNOSIS — R29818 Other symptoms and signs involving the nervous system: Secondary | ICD-10-CM

## 2022-12-07 DIAGNOSIS — H532 Diplopia: Secondary | ICD-10-CM

## 2022-12-07 DIAGNOSIS — R200 Anesthesia of skin: Secondary | ICD-10-CM | POA: Diagnosis not present

## 2022-12-07 DIAGNOSIS — G35 Multiple sclerosis: Secondary | ICD-10-CM | POA: Diagnosis not present

## 2022-12-07 MED ORDER — PREDNISONE 50 MG PO TABS: ORAL_TABLET | ORAL | 0 refills | Status: DC

## 2022-12-07 NOTE — Progress Notes (Signed)
[facility]  PATIENT: Natalie Zimmerman DOB: 1991-02-03  REFERRING DOCTOR OR PCP: Irine Seal, MD SOURCE: Patient, notes from recent hospitalization, imaging and lab reports, MRI images personally reviewed.  _________________________________   HISTORICAL  CHIEF COMPLAINT:  Chief Complaint  Patient presents with   Room 10    Pt is here Alone. Pt states that she is having tingling and pins and needles sensation in her neck. Pt states that she went to Urgent Care this morning.     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  Update 12/07/2022 She is on Ocrevus recently and tolerates it well.       Two days ago, she started noting pins and needles tingling in the neck.   It would shoot down the spine.   Bending neck may worsen it some.     This morning while driving, she noted blurry vision on the left.  She also does colors are mildly desaturated.  No eye pain.   She had ON in the 11/2021 and we did a few days of Solu-Medrol with symptoms resolving.     She presented with diplopia in 2021.     She is on Ocrevus since May 2022 showed breakthrough activity with 4-5 new .  lesions/enhancement.,     She had been on glatiramer  Last infusion was Ma y 2023.     The redness resolved but she noted visual loss OS.   Colors are mildly saturated.    Acuity is mildly worse.   No scotomata/VF change.    She has not had any other neurologic symptom.    She denies diplopia (had in 2020).    She had eye pain worse with movements or blinking.      Gait, strengh, coordination and vision are fine.       Bladder function is fine.    She is experiencing numbness, tingling with some dysesthesia bilaterally, starting at night worse when she wakes up.   She also feels stiff upon awakening.    She feels better after she moves around.   If she wakes up (not most nights) she has troulbe falling back asleep due to the dysesthesias.    She does have foci at [facility]  and C3C4, T1T2, T3, T3T4, T6 and T11T12.     She very rarely gets a Lhermitte sign (1/month)   She has some fatigue.   She notes generally more with her 106 month old.  Mood and cognition are doing well.  She delivered her son 02/16/2021 .  She also has a son born in 2020.   She stopped  breastfeeding in January.   She is not planning on more                                 MS history: She had the onset of blurry vision when she looked to her left July 01, 2020.  This worsened over the next two days, over the weekend, especially looking at headlights while driving.   She saw ophthalmology 07/06/2020 and they suspected a neurologic disorder.   She was referred to [facility] ED 07/06/2020.   MRIs of the brain and spine showed multiple lesions consistent with MS. Some of the foci enhanced after contrast consistent with more acute lesions in confirming the diagnosis. She was admitted and had 5 days of IV SOlu-Medrol.  Symptoms improved and she was discharged home.   While in the hospital, it was discovered that she was about 6 to [redacted] weeks pregnant.     Besides diplopia, she had no other symptoms at the time.   However, in the past, she did note some numbness or a hot sensation in her feet but never > 1 day.   However, she had intermittent Lhermitte signs since July 2021.     She denies current numbness, weakness or ataxia.   Gait is fine and stairs are not a problem without holding the bannister.  Bladder function is fine.Switched to [employer] 06/2021 due to breakthrough activity whil on Copaxone   She had possible ON left 11/2021 and we did a few days of Slu-medrol with improvement.   Possible relapse 12/05/2022  She has no FH of MS or autoimmune disease.      Imaging: MRI 12/20/2021 showed  Multiple T2/FLAIR hyperintense foci in the hemispheres in a pattern consistent with chronic demyelinating plaque associated with multiple sclerosis.  None of the foci enhanced or appeared to be acute.  Compared to the  MRI from 05/10/2021, there are no new lesions.  Additionally, the abnormal enhancement of some of the foci in 2022 has resolved.     No acute findings.  Normal enhancement pattern.  MRI 05/10/2021 showed multiple T2/FLAIR hyperintense foci in the hemispheres in a pattern and configuration consistent with demyelinating plaques associated with multiple sclerosis.  Four of the foci enhance after contrast consistent with acute or subacute lesions.  Besides these foci, an additional focus in the deep white matter of the right frontal lobe was not present on the previous MRI  MRI of the brain and orbits 07/06/2020 showed multiple T2/FLAIR hyperintense foci predominantly in the periventricular, juxtacortical and deep white matter of the hemispheres. Many of the periventricular foci are radially oriented to the ventricles.  At least 13 foci enhanced after contrast was administered.  The enhancing foci were very small and would not be expected to be symptomatic.Marland Kitchen There were just a couple small nonenhancing nonenhancing foci in the brainstem and cerebellum, none in a position that would be expected to cause diplopia. Optic nerves were normal.  MRI of the cervical and thoracic spine performed 07/07/2020 showed T2 hyperintense foci posteriorly at C2-C3 and towards the left at C3-C4, anteriorly at T1-T2, to the left at T3, to the right at T3-T4, centrally at T5, to the anterolateral right at T6 and centrally at T11-T12. Due to her pregnancy, the study was done without contrast.  REVIEW OF SYSTEMS: Constitutional: No fevers, chills, sweats, or change in appetite Eyes: No visual changes, double vision, eye pain Ear, nose and throat: No hearing loss, ear pain, nasal congestion, sore throat Cardiovascular: No chest pain, palpitations Respiratory:  No shortness of breath at rest or with exertion.   No wheezes GastrointestinaI: No nausea, vomiting, diarrhea, abdominal pain, fecal incontinence Genitourinary:  No dysuria,  urinary retention or frequency.  No nocturia. Musculoskeletal:  No neck pain, back pain Integumentary: No rash, pruritus, skin lesions Neurological: as above Psychiatric: No depression at this time.  No anxiety Endocrine: No palpitations, diaphoresis, change in appetite, change in weigh or increased thirst Hematologic/Lymphatic:  No anemia, purpura, petechiae. Allergic/Immunologic: No itchy/runny eyes, nasal congestion, recent allergic reactions, rashes  ALLERGIES: No Known Allergies  HOME MEDICATIONS:  Current Outpatient Medications:    escitalopram (LEXAPRO) 20 MG tablet, Take 20 mg by mouth daily., Disp: , Rfl:    Ocrelizumab (OCREVUS IV),  Inject into the vein every 6 (six) months., Disp: , Rfl:    predniSONE (DELTASONE) 50 MG tablet, 20 pills (1000 mg) po daily x 3 days, Disp: 60 tablet, Rfl: 0  PAST MEDICAL HISTORY: Past Medical History:  Diagnosis Date   Medical history non-contributory    Multiple sclerosis ([place]) 2021   PCOS (polycystic ovarian syndrome)    Post partum depression 2019   Post-partum depression 2019    PAST SURGICAL HISTORY: Past Surgical History:  Procedure Laterality Date   CESAREAN SECTION N/A 11/27/2018   Procedure: CESAREAN SECTION;  Surgeon: Janyth Pupa, DO;  Location: [place] [facility]S;  Service: Obstetrics;  Laterality: N/A;    FAMILY HISTORY: Family History  Problem Relation Age of Onset   Cancer Maternal Grandmother    Heart disease Maternal Grandfather    Cancer Paternal Grandfather    Cancer Maternal Aunt    Cancer Paternal Aunt     SOCIAL HISTORY:  Social History   Socioeconomic History   Marital status: Single    Spouse name: Not on file   Number of children: 1   Years of education: Not on file   Highest education level: Not on file  Occupational History   Occupation: [employer]  Tobacco Use   Smoking status: Never   Smokeless tobacco: Never  Vaping Use   Vaping Use: Never used  Substance and Sexual Activity    Alcohol use: No    Alcohol/week: 0.0 standard drinks of alcohol   Drug use: No   Sexual activity: Yes  Other Topics Concern   Not on file  Social History Narrative   Lives with partner and son   Caffeine use: Coffee    Right handed   Social Determinants of Health   Financial Resource Strain: Not on file  Food Insecurity: Not on file  Transportation Needs: Not on file  Physical Activity: Not on file  Stress: Not on file  Social Connections: Not on file  Intimate Partner Violence: Not on file     PHYSICAL EXAM  Vitals:   12/07/22 1135  BP: (!) 135/93  Pulse: 73  Weight: 153 lb (69.4 kg)  Height: 5\' 2"  (1.575 m)    Body mass index is 27.98 kg/m.  VA   OD 20/20-1    OS 20/25   General: The patient is well-developed and well-nourished and in no acute distress  HEENT:  Head is [place]/AT.  Sclera are anicteric and non-erythematous.    Normal fundoscopic exam.  No papilledema  Skin: Extremities are without rash or  edema.  Neurologic Exam  Mental status: The patient is alert and oriented x 3 at the time of the examination. The patient has apparent normal recent and remote memory, with an apparently normal attention span and concentration ability.  speech is normal.  Cranial nerves: Extraocular movements are full.  VA as above.  Color vision is symmetric..  Facial strength and sensation were normal.    Hearing was normal.  Motor:  Muscle bulk is normal.   Tone is normal. Strength is  5 / 5 in all 4 extremities.   Sensory: Sensory testing is intact to pinprick, soft touch and vibration sensation in all 4 extremities.  Coordination: Cerebellar testing reveals good finger-nose-finger and heel-to-shin bilaterally.  Gait and station: Station is normal.   Gait is normal.  Tandem gait is mildly wide    Romberg is negative.   Reflexes: Deep tendon reflexes were normal.     DIAGNOSTIC DATA (LABS, IMAGING,  TESTING) - I reviewed patient records, labs, notes, testing and  imaging myself where available.  Lab Results  Component Value Date   WBC 8.8 11/23/2022   HGB 13.2 11/23/2022   HCT 38.5 11/23/2022   MCV 90 11/23/2022   PLT 302 11/23/2022      Component Value Date/Time   NA 144 05/11/2021 1339   K 4.4 05/11/2021 1339   CL 106 05/11/2021 1339   CO2 24 05/11/2021 1339   GLUCOSE 77 05/11/2021 1339   GLUCOSE 138 (H) 07/10/2020 0444   BUN 20 05/11/2021 1339   CREATININE 0.73 05/11/2021 1339   CALCIUM 9.4 05/11/2021 1339   PROT 7.6 05/11/2021 1339   ALBUMIN 4.8 05/11/2021 1339   AST 19 05/11/2021 1339   ALT 24 05/11/2021 1339   ALKPHOS 98 05/11/2021 1339   BILITOT <0.2 05/11/2021 1339   GFRNONAA >60 07/10/2020 0444   GFRAA >60 11/27/2018 1957   No results found for: "CHOL", "HDL", "[place]", "LDLDIRECT", "TRIG", "CHOLHDL" Lab Results  Component Value Date   HGBA1C 5.4 07/07/2020       ASSESSMENT AND PLAN  Multiple sclerosis ([place]) - Plan: MR BRAIN W WO CONTRAST, MR CERVICAL SPINE W WO CONTRAST  Numbness - Plan: MR BRAIN W WO CONTRAST, MR CERVICAL SPINE W WO CONTRAST  Lhermitte's sign positive - Plan: MR BRAIN W WO CONTRAST  Diplopia  1.   Continue Ocrevus.  Possible exacerbation.  I will have her do 3 days of high-dose prednisone (1000 mg daily).  Will also check MRI of the brain and cervical spine to determine if there has been breakthrough activity.  If this is occurring consider a different disease modifying therapy.  \ 2.   Stay active exercise as tolerated. 3.   Return in 6 months or sooner for new or worsening neurologic symptoms.    Addison Whidbee A. Felecia Shelling, MD, [facility] [id number], [id number] PM Certified in Neurology, Clinical Neurophysiology, Sleep Medicine and Neuroimaging  [facility] [address] [place], [place] [address] [phone]

## 2022-12-07 NOTE — Telephone Encounter (Signed)
[facility] Case Number: [id number], [employer] auth: [id number] exp. 12/07/22-02/04/23 sent to GI [id number]

## 2022-12-19 LAB — CBC WITH DIFFERENTIAL/PLATELET
Basophils Absolute: 0.1 10*3/uL (ref 0.0–0.2)
Basos: 1 %
EOS (ABSOLUTE): 0.1 10*3/uL (ref 0.0–0.4)
Eos: 1 %
Hematocrit: 38.5 % (ref 34.0–46.6)
Hemoglobin: 13.2 g/dL (ref 11.1–15.9)
Immature Grans (Abs): 0 10*3/uL (ref 0.0–0.1)
Immature Granulocytes: 0 %
Lymphocytes Absolute: 2.4 10*3/uL (ref 0.7–3.1)
Lymphs: 27 %
MCH: 31 pg (ref 26.6–33.0)
MCHC: 34.3 g/dL (ref 31.5–35.7)
MCV: 90 fL (ref 79–97)
Monocytes Absolute: 0.6 10*3/uL (ref 0.1–0.9)
Monocytes: 6 %
Neutrophils Absolute: 5.7 10*3/uL (ref 1.4–7.0)
Neutrophils: 65 %
Platelets: 302 10*3/uL (ref 150–450)
RBC: 4.26 x10E6/uL (ref 3.77–5.28)
RDW: 13.1 % (ref 11.7–15.4)
WBC: 8.8 10*3/uL (ref 3.4–10.8)

## 2022-12-19 LAB — IGG, IGA, IGM
IgG (Immunoglobin G), Serum: 933 mg/dL (ref 586–1602)
IgM (Immunoglobulin M), Srm: 43 mg/dL (ref 26–217)

## 2022-12-22 ENCOUNTER — Ambulatory Visit
Admission: RE | Admit: 2022-12-22 | Discharge: 2022-12-22 | Disposition: A | Discharge status: Home / Self Care | Payer: Medicaid Other | Source: Ambulatory Visit | Attending: Neurology | Admitting: Neurology

## 2022-12-22 ENCOUNTER — Ambulatory Visit
Admission: RE | Admit: 2022-12-22 | Discharge: 2022-12-22 | Disposition: A | Discharge status: Home / Self Care | Payer: 59 | Source: Ambulatory Visit | Attending: Neurology | Admitting: Neurology

## 2022-12-22 DIAGNOSIS — R200 Anesthesia of skin: Secondary | ICD-10-CM

## 2022-12-22 DIAGNOSIS — G35 Multiple sclerosis: Secondary | ICD-10-CM

## 2022-12-22 DIAGNOSIS — R29818 Other symptoms and signs involving the nervous system: Secondary | ICD-10-CM

## 2022-12-22 MED ORDER — GADOPICLENOL 0.5 MMOL/ML IV SOLN
7.5000 mL | Freq: Once | INTRAVENOUS | Status: AC | PRN
  Administered 2022-12-22: 7.5 mL via INTRAVENOUS

## 2022-12-28 ENCOUNTER — Other Ambulatory Visit: Payer: Self-pay

## 2023-03-07 ENCOUNTER — Other Ambulatory Visit: Payer: Self-pay | Admitting: *Deleted

## 2023-03-07 ENCOUNTER — Encounter: Payer: Self-pay | Admitting: *Deleted

## 2023-03-07 DIAGNOSIS — G35 Multiple sclerosis: Secondary | ICD-10-CM

## 2023-03-07 DIAGNOSIS — Z79899 Other long term (current) drug therapy: Secondary | ICD-10-CM

## 2023-03-13 ENCOUNTER — Other Ambulatory Visit (INDEPENDENT_AMBULATORY_CARE_PROVIDER_SITE_OTHER): Payer: Self-pay

## 2023-03-13 DIAGNOSIS — Z0289 Encounter for other administrative examinations: Secondary | ICD-10-CM

## 2023-03-13 DIAGNOSIS — Z79899 Other long term (current) drug therapy: Secondary | ICD-10-CM

## 2023-03-13 DIAGNOSIS — G35 Multiple sclerosis: Secondary | ICD-10-CM

## 2023-03-14 LAB — IGG, IGA, IGM
IgA/Immunoglobulin A, Serum: 309 mg/dL (ref 87–352)
IgG (Immunoglobin G), Serum: 783 mg/dL (ref 586–1602)
IgM (Immunoglobulin M), Srm: 46 mg/dL (ref 26–217)

## 2023-06-06 NOTE — Progress Notes (Deleted)
No chief complaint on file.   HISTORY OF PRESENT ILLNESS:  06/06/23 ALL:  Natalie Zimmerman is a 32 y.o. 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 She continues Ocrevus infusions. Last infusion 09/2022. Labs were stable 05/2022. MRI brain stable 12/2021.   She is doing well. No new or exacerbating symptoms. Gait is normal. No balance concerns. No assistive device. She does have burning pain in legs that is worse at night. Pain is tolerable and not disrupting sleep.   No diplopia or vision changes.   She is followed by psychiatry every 3 months. She continues escitalopram 20mg  daily. She feels mood is good. She is sleeping well. Usually 6-8 hours.   Saw PCP in 2023. Unsure of name.   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   Update 12/08/2021 She was diagnosed with MS 2021 when about [redacted] weeks pregnant.  She switched form Copaxone to Ocrevus recently and tolerates it well.        She had an episode of L>R visual change lasting 2 days.  Vision was blurry and blinking hurt more than other eye movements which hurt a bit.  The left eye was red.  She has had soe HA but not necessarily associated with the redness in her eye.     It improved.  She had a similar even earlier this year an saw ophthalmology and they placed her on Abx drops      She is on Ocrevus since May 2022 showed breakthrough activity with 4-5 new .  lesions/enhancement.,     She had been on glatiramer  Last infusion was Ma y 2023.     The redness resolved but she noted visual loss OS.   Colors are mildly saturated.    Acuity is mildly worse.   No scotomata/VF change.    She has not had any other neurologic symptom.    She denies diplopia (had in 2020).    She had eye pain worse with movements or blinking.       Gait, strengh, coordination and vision are fine.       Bladder function is fine.    She is experiencing  numbness, tingling with some dysesthesia bilaterally, starting at night worse when she wakes up.   She also feels stiff upon awakening.    She feels better after she moves around.   If she wakes up (not most nights) she has troulbe falling back asleep due to the dysesthesias.    She does have foci at [facility] and C3C4, T1T2, T3, T3T4, T6 and T11T12.     She very rarely gets a Lhermitte sign (1/month)    She has some fatigue.   She notes generally more with her 82 month old.  Mood and cognition are doing well.   She delivered her son 02/16/2021 .  She also has a son born in 2020.   She stopped  breastfeeding in January.   She is not planning on more  MS history: She had the onset of blurry vision when she looked to her left July 01, 2020.  This worsened over the next two days, over the weekend, especially looking at headlights while driving.   She saw ophthalmology 07/06/2020 and they suspected a neurologic disorder.   She was referred to the [facility] ED 07/06/2020.   MRIs of the brain and spine showed multiple lesions consistent with MS. Some of the foci enhanced after contrast consistent with more acute lesions in confirming the diagnosis. She was admitted and had 5 days of IV SOlu-Medrol.   Symptoms improved and she was discharged home.   While in the hospital, it was discovered that she was about 6 to [redacted] weeks pregnant.     Besides diplopia, she had no other symptoms at the time.   However, in the past, she did note some numbness or a hot sensation in her feet but never > 1 day.   However, she had intermittent Lhermitte signs since July 2021.     She denies current numbness, weakness or ataxia.   Gait is fine and stairs  are not a problem without holding the bannister.  Bladder function is fine.Switched to [employer] 06/2021 due to breakthrough activity whil on Copaxone      She has no FH of MS or autoimmune disease.        Imaging: MRI 12/20/2021 showed  Multiple T2/FLAIR hyperintense foci in the hemispheres in a pattern consistent with chronic demyelinating plaque associated with multiple sclerosis.  None of the foci enhanced or appeared to be acute.  Compared to the MRI from 05/10/2021, there are no new lesions.  Additionally, the abnormal enhancement of some of the foci in 2022 has resolved.     No acute findings.  Normal enhancement pattern.   MRI 05/10/2021 showed multiple T2/FLAIR hyperintense foci in the hemispheres in a pattern and configuration consistent with demyelinating plaques associated with multiple sclerosis.  Four of the foci enhance after contrast consistent with acute or subacute lesions.  Besides these foci, an additional focus in the deep white matter of the right frontal lobe was not present on the previous MRI   MRI of the brain and orbits 07/06/2020 showed multiple T2/FLAIR hyperintense foci predominantly in the periventricular, juxtacortical and deep white matter of the hemispheres. Many of the periventricular foci are radially oriented to the ventricles.  At least 13 foci enhanced after contrast was administered.  The enhancing foci were very small and would not be expected to be symptomatic.Marland Kitchen There were just a couple small nonenhancing nonenhancing foci in the brainstem and cerebellum, none in a position that would be expected to cause diplopia. Optic nerves were normal.   MRI of the cervical and thoracic spine performed 07/07/2020 showed T2 hyperintense foci posteriorly at C2-C3 and towards the left at C3-C4, anteriorly at T1-T2, to the left at T3, to the right at T3-T4, centrally at T5, to the anterolateral right at T6 and centrally at T11-T12. Due to her pregnancy, the study was done without  contrast.   REVIEW OF SYSTEMS: Out of a complete 14 system review of symptoms, the patient complains only of the following symptoms, burning pain of legs and all other reviewed systems are negative.   ALLERGIES: No Known Allergies   HOME MEDICATIONS: Outpatient Medications Prior to Visit  Medication Sig Dispense Refill   escitalopram (LEXAPRO) 20 MG tablet Take 20 mg by mouth daily.     Ocrelizumab (OCREVUS IV) Inject into the vein  every 6 (six) months.     predniSONE (DELTASONE) 50 MG tablet 20 pills (1000 mg) po daily x 3 days 60 tablet 0   No facility-administered medications prior to visit.     PAST MEDICAL HISTORY: Past Medical History:  Diagnosis Date   Medical history non-contributory    Multiple sclerosis ([facility]) 2021   PCOS (polycystic ovarian syndrome)    Post partum depression 2019   Post-partum depression 2019     PAST SURGICAL HISTORY: Past Surgical History:  Procedure Laterality Date   CESAREAN SECTION N/A 11/27/2018   Procedure: CESAREAN SECTION;  Surgeon: Myna Hidalgo, DO;  Location: [facility];  Service: Obstetrics;  Laterality: N/A;     FAMILY HISTORY: Family History  Problem Relation Age of Onset   Cancer Maternal Grandmother    Heart disease Maternal Grandfather    Cancer Paternal Grandfather    Cancer Maternal Aunt    Cancer Paternal Aunt      SOCIAL HISTORY: Social History   Socioeconomic History   Marital status: Single    Spouse name: Not on file   Number of children: 1   Years of education: Not on file   Highest education level: Not on file  Occupational History   Occupation: [employer]  Tobacco Use   Smoking status: Never   Smokeless tobacco: Never  Vaping Use   Vaping status: Never Used  Substance and Sexual Activity   Alcohol use: No    Alcohol/week: 0.0 standard drinks of alcohol   Drug use: No   Sexual activity: Yes  Other Topics Concern   Not on file  Social History Narrative   Lives with partner and son    Caffeine use: Coffee    Right handed   Social Determinants of Health   Financial Resource Strain: Not on file  Food Insecurity: Not on file  Transportation Needs: Not on file  Physical Activity: Not on file  Stress: Not on file  Social Connections: Unknown (01/24/2022)   [facility]   Social Network    Social Network: Not on file  Intimate Partner Violence: Unknown (12/16/2021)   [facility] from [facility], [facility]   HITS    Physically Hurt: Not on file    Insult or Talk Down To: Not on file    Threaten Physical Harm: Not on file    Scream or Curse: Not on file     PHYSICAL EXAM  There were no vitals filed for this visit.  There is no height or weight on file to calculate BMI.  Generalized: Well developed, in no acute distress  Cardiology: normal rate and rhythm, no murmur auscultated  Respiratory: clear to auscultation bilaterally    Neurological examination  Mentation: Alert oriented to time, place, history taking. Follows all commands speech and language fluent Cranial nerve II-XII: Pupils were equal round reactive to light. Extraocular movements were full, visual field were full on confrontational test. Facial sensation and strength were normal. Uvula tongue midline. Head turning and shoulder shrug  were normal and symmetric. Motor: The motor testing reveals 5 over 5 strength of all 4 extremities. Good symmetric motor tone is noted throughout.  Sensory: Sensory testing is intact to soft touch on all 4 extremities. No evidence of extinction is noted.  Coordination: Cerebellar testing reveals good finger-nose-finger and heel-to-shin bilaterally.  Gait and station: Gait is normal. Tandem gait is normal. Romberg is negative. No drift is seen.  Reflexes: Deep tendon reflexes are symmetric  and normal bilaterally.    DIAGNOSTIC DATA (LABS, IMAGING, TESTING) - I reviewed patient records, labs, notes, testing and imaging myself where  available.  Lab Results  Component Value Date   WBC 8.8 11/23/2022   HGB 13.2 11/23/2022   HCT 38.5 11/23/2022   MCV 90 11/23/2022   PLT 302 11/23/2022      Component Value Date/Time   NA 144 05/11/2021 1339   K 4.4 05/11/2021 1339   CL 106 05/11/2021 1339   CO2 24 05/11/2021 1339   GLUCOSE 77 05/11/2021 1339   GLUCOSE 138 (H) 07/10/2020 0444   BUN 20 05/11/2021 1339   CREATININE 0.73 05/11/2021 1339   CALCIUM 9.4 05/11/2021 1339   PROT 7.6 05/11/2021 1339   ALBUMIN 4.8 05/11/2021 1339   AST 19 05/11/2021 1339   ALT 24 05/11/2021 1339   ALKPHOS 98 05/11/2021 1339   BILITOT <0.2 05/11/2021 1339   GFRNONAA >60 07/10/2020 0444   GFRAA >60 11/27/2018 1957   No results found for: "CHOL", "HDL", "LDLCALC", "LDLDIRECT", "TRIG", "CHOLHDL" Lab Results  Component Value Date   HGBA1C 5.4 07/07/2020   Lab Results  Component Value Date   VITAMINB12 417 07/15/2020   No results found for: "TSH"      No data to display               No data to display           ASSESSMENT AND PLAN  32 y.o. year old female  has a past medical history of Medical history non-contributory, Multiple sclerosis ([facility]) (2021), PCOS (polycystic ovarian syndrome), Post partum depression (2019), and Post-partum depression (2019). here with    No diagnosis found.  Layla Clydine Mchatton is doing well. We will continue Ocrevus infusions every 6 months. I will update labs, today. She will continue to monitor symptoms and let me know if she has any new or worsening symptoms. Healthy lifestyle habits encouraged. She will follow up with PCP as directed. She will return to see me in 6 months, sooner if needed. She verbalizes understanding and agreement with this plan.   No orders of the defined types were placed in this encounter.    No orders of the defined types were placed in this encounter.    Shawnie Dapper, MSN, FNP-C 06/06/2023, 8:12 AM  [facility] [address], [address] [place], [place] [address] [phone]

## 2023-06-07 ENCOUNTER — Ambulatory Visit: Payer: 59 | Admitting: Family Medicine

## 2023-06-11 NOTE — Patient Instructions (Signed)
Below is our plan:  We will continue Ocrevus infusion. We will update labs today.   Please make sure you are staying well hydrated. I recommend 50-60 ounces daily. Well balanced diet and regular exercise encouraged. Consistent sleep schedule with 6-8 hours recommended.   Please continue follow up with care team as directed.   Follow up with Dr Epimenio Foot in 6 months   You may receive a survey regarding today's visit. I encourage you to leave honest feed back as I do use this information to improve patient care. Thank you for seeing me today!

## 2023-06-11 NOTE — Progress Notes (Unsigned)
No chief complaint on file.   HISTORY OF PRESENT ILLNESS:  06/11/23 ALL:  Natalie Zimmerman is a 32 y.o. 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 She continues Ocrevus infusions. Last infusion . Labs were stable 03/2023. MRI brain stable 12/2022.   She is doing well. No new or exacerbating symptoms. Gait is normal. No balance concerns. No assistive device. She does have burning pain in legs that is worse at night. Pain is tolerable and not disrupting sleep.   No diplopia or vision changes.   She is followed by psychiatry every 3 months. She continues escitalopram 20mg  daily. She feels mood is good. She is sleeping well. Usually 6-8 hours.   Saw PCP in 2023. Unsure of name.   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   Update 12/08/2021 She was diagnosed with MS 2021 when about [redacted] weeks pregnant.  She switched form Copaxone to Ocrevus recently and tolerates it well.        She had an episode of L>R visual change lasting 2 days.  Vision was blurry and blinking hurt more than other eye movements which hurt a bit.  The left eye was red.  She has had soe HA but not necessarily associated with the redness in her eye.     It improved.  She had a similar even earlier this year an saw ophthalmology and they placed her on Abx drops      She is on Ocrevus since May 2022 showed breakthrough activity with 4-5 new .  lesions/enhancement.,     She had been on glatiramer  Last infusion was Ma y 2023.     The redness resolved but she noted visual loss OS.   Colors are mildly saturated.    Acuity is mildly worse.   No scotomata/VF change.    She has not had any other neurologic symptom.    She denies diplopia (had in 2020).    She had eye pain worse with movements or blinking.       Gait, strengh, coordination and vision are fine.       Bladder function is fine.    She is experiencing numbness,  tingling with some dysesthesia bilaterally, starting at night worse when she wakes up.   She also feels stiff upon awakening.    She feels better after she moves around.   If she wakes up (not most nights) she has troulbe falling back asleep due to the dysesthesias.    She does have foci at [facility] and C3C4, T1T2, T3, T3T4, T6 and T11T12.     She very rarely gets a Lhermitte sign (1/month)    She has some fatigue.   She notes generally more with her 16 month old.  Mood and cognition are doing well.   She delivered her son 02/16/2021 .  She also has a son born in 2020.   She stopped  breastfeeding in January.   She is not planning on more  MS history: She had the onset of blurry vision when she looked to her left July 01, 2020.  This worsened over the next two days, over the weekend, especially looking at headlights while driving.   She saw ophthalmology 07/06/2020 and they suspected a neurologic disorder.   She was referred to [facility] ED 07/06/2020.   MRIs of the brain and spine showed multiple lesions consistent with MS. Some of the foci enhanced after contrast consistent with more acute lesions in confirming the diagnosis. She was admitted and had 5 days of IV SOlu-Medrol.   Symptoms improved and she was discharged home.   While in the hospital, it was discovered that she was about 6 to [redacted] weeks pregnant.     Besides diplopia, she had no other symptoms at the time.   However, in the past, she did note some numbness or a hot sensation in her feet but never > 1 day.   However, she had intermittent Lhermitte signs since July 2021.     She denies current numbness, weakness or ataxia.   Gait is fine and stairs are not a  problem without holding the bannister.  Bladder function is fine.Switched to [employer] 06/2021 due to breakthrough activity whil on Copaxone      She has no FH of MS or autoimmune disease.        Imaging: MRI 12/20/2021 showed  Multiple T2/FLAIR hyperintense foci in the hemispheres in a pattern consistent with chronic demyelinating plaque associated with multiple sclerosis.  None of the foci enhanced or appeared to be acute.  Compared to the MRI from 05/10/2021, there are no new lesions.  Additionally, the abnormal enhancement of some of the foci in 2022 has resolved.     No acute findings.  Normal enhancement pattern.   MRI 05/10/2021 showed multiple T2/FLAIR hyperintense foci in the hemispheres in a pattern and configuration consistent with demyelinating plaques associated with multiple sclerosis.  Four of the foci enhance after contrast consistent with acute or subacute lesions.  Besides these foci, an additional focus in the deep white matter of the right frontal lobe was not present on the previous MRI   MRI of the brain and orbits 07/06/2020 showed multiple T2/FLAIR hyperintense foci predominantly in the periventricular, juxtacortical and deep white matter of the hemispheres. Many of the periventricular foci are radially oriented to the ventricles.  At least 13 foci enhanced after contrast was administered.  The enhancing foci were very small and would not be expected to be symptomatic.Marland Kitchen There were just a couple small nonenhancing nonenhancing foci in the brainstem and cerebellum, none in a position that would be expected to cause diplopia. Optic nerves were normal.   MRI of the cervical and thoracic spine performed 07/07/2020 showed T2 hyperintense foci posteriorly at C2-C3 and towards the left at C3-C4, anteriorly at T1-T2, to the left at T3, to the right at T3-T4, centrally at T5, to the anterolateral right at T6 and centrally at T11-T12. Due to her pregnancy, the study was done without  contrast.   REVIEW OF SYSTEMS: Out of a complete 14 system review of symptoms, the patient complains only of the following symptoms, burning pain of legs and all other reviewed systems are negative.   ALLERGIES: No Known Allergies   HOME MEDICATIONS: Outpatient Medications Prior to Visit  Medication Sig Dispense Refill   escitalopram (LEXAPRO) 20 MG tablet Take 20 mg by mouth daily.     Ocrelizumab (OCREVUS IV) Inject into the vein  every 6 (six) months.     predniSONE (DELTASONE) 50 MG tablet 20 pills (1000 mg) po daily x 3 days 60 tablet 0   No facility-administered medications prior to visit.     PAST MEDICAL HISTORY: Past Medical History:  Diagnosis Date   Medical history non-contributory    Multiple sclerosis (HCC) 2021   PCOS (polycystic ovarian syndrome)    Post partum depression 2019   Post-partum depression 2019     PAST SURGICAL HISTORY: Past Surgical History:  Procedure Laterality Date   CESAREAN SECTION N/A 11/27/2018   Procedure: CESAREAN SECTION;  Surgeon: Myna Hidalgo, DO;  Location: [facility] LD ORS;  Service: Obstetrics;  Laterality: N/A;     FAMILY HISTORY: Family History  Problem Relation Age of Onset   Cancer Maternal Grandmother    Heart disease Maternal Grandfather    Cancer Paternal Grandfather    Cancer Maternal Aunt    Cancer Paternal Aunt      SOCIAL HISTORY: Social History   Socioeconomic History   Marital status: Single    Spouse name: Not on file   Number of children: 1   Years of education: Not on file   Highest education level: Not on file  Occupational History   Occupation: [employer]  Tobacco Use   Smoking status: Never   Smokeless tobacco: Never  Vaping Use   Vaping status: Never Used  Substance and Sexual Activity   Alcohol use: No    Alcohol/week: 0.0 standard drinks of alcohol   Drug use: No   Sexual activity: Yes  Other Topics Concern   Not on file  Social History Narrative   Lives with partner and son    Caffeine use: Coffee    Right handed   Social Determinants of Health   Financial Resource Strain: Not on file  Food Insecurity: Not on file  Transportation Needs: Not on file  Physical Activity: Not on file  Stress: Not on file  Social Connections: Unknown (01/24/2022)   [facility]   Social Network    Social Network: Not on file  Intimate Partner Violence: Unknown (12/16/2021)   [facility]   HITS    Physically Hurt: Not on file    Insult or Talk Down To: Not on file    Threaten Physical Harm: Not on file    Scream or Curse: Not on file     PHYSICAL EXAM  There were no vitals filed for this visit.  There is no height or weight on file to calculate BMI.  Generalized: Well developed, in no acute distress  Cardiology: normal rate and rhythm, no murmur auscultated  Respiratory: clear to auscultation bilaterally    Neurological examination  Mentation: Alert oriented to time, place, history taking. Follows all commands speech and language fluent Cranial nerve II-XII: Pupils were equal round reactive to light. Extraocular movements were full, visual field were full on confrontational test. Facial sensation and strength were normal. Uvula tongue midline. Head turning and shoulder shrug  were normal and symmetric. Motor: The motor testing reveals 5 over 5 strength of all 4 extremities. Good symmetric motor tone is noted throughout.  Sensory: Sensory testing is intact to soft touch on all 4 extremities. No evidence of extinction is noted.  Coordination: Cerebellar testing reveals good finger-nose-finger and heel-to-shin bilaterally.  Gait and station: Gait is normal. Tandem gait is normal. Romberg is negative. No drift is seen.  Reflexes: Deep tendon reflexes are symmetric  and normal bilaterally.    DIAGNOSTIC DATA (LABS, IMAGING, TESTING) - I reviewed patient records, labs, notes, testing and imaging myself where  available.  Lab Results  Component Value Date   WBC 8.8 11/23/2022   HGB 13.2 11/23/2022   HCT 38.5 11/23/2022   MCV 90 11/23/2022   PLT 302 11/23/2022      Component Value Date/Time   NA 144 05/11/2021 1339   K 4.4 05/11/2021 1339   CL 106 05/11/2021 1339   CO2 24 05/11/2021 1339   GLUCOSE 77 05/11/2021 1339   GLUCOSE 138 (H) 07/10/2020 0444   BUN 20 05/11/2021 1339   CREATININE 0.73 05/11/2021 1339   CALCIUM 9.4 05/11/2021 1339   PROT 7.6 05/11/2021 1339   ALBUMIN 4.8 05/11/2021 1339   AST 19 05/11/2021 1339   ALT 24 05/11/2021 1339   ALKPHOS 98 05/11/2021 1339   BILITOT <0.2 05/11/2021 1339   GFRNONAA >60 07/10/2020 0444   GFRAA >60 11/27/2018 1957   No results found for: "CHOL", "HDL", "LDLCALC", "LDLDIRECT", "TRIG", "CHOLHDL" Lab Results  Component Value Date   HGBA1C 5.4 07/07/2020   Lab Results  Component Value Date   VITAMINB12 417 07/15/2020   No results found for: "TSH"      No data to display               No data to display           ASSESSMENT AND PLAN  32 y.o. year old female  has a past medical history of Medical history non-contributory, Multiple sclerosis (HCC) (2021), PCOS (polycystic ovarian syndrome), Post partum depression (2019), and Post-partum depression (2019). here with    No diagnosis found.  Natalie Zimmerman is doing well. We will continue Ocrevus infusions every 6 months. I will update labs, today. She will continue to monitor symptoms and let me know if she has any new or worsening symptoms. Healthy lifestyle habits encouraged. She will follow up with PCP as directed. She will return to see me in 6 months, sooner if needed. She verbalizes understanding and agreement with this plan.   No orders of the defined types were placed in this encounter.    No orders of the defined types were placed in this encounter.    Shawnie Dapper, MSN, FNP-C 06/11/2023, 5:05 PM  [facility] [address], [address] [place], [place] [address] [phone]

## 2023-06-14 ENCOUNTER — Encounter: Payer: Self-pay | Admitting: Family Medicine

## 2023-06-14 ENCOUNTER — Ambulatory Visit: Payer: 59 | Admitting: Family Medicine

## 2023-06-14 VITALS — BP 120/90 | HR 76 | Ht 62.0 in | Wt 149.0 lb

## 2023-06-14 DIAGNOSIS — R208 Other disturbances of skin sensation: Secondary | ICD-10-CM

## 2023-06-14 DIAGNOSIS — G35 Multiple sclerosis: Secondary | ICD-10-CM

## 2023-06-14 DIAGNOSIS — Z79899 Other long term (current) drug therapy: Secondary | ICD-10-CM | POA: Diagnosis not present

## 2023-06-15 LAB — CBC WITH DIFFERENTIAL/PLATELET
Basophils Absolute: 0.1 10*3/uL (ref 0.0–0.2)
Basos: 1 %
EOS (ABSOLUTE): 0.1 10*3/uL (ref 0.0–0.4)
Eos: 1 %
Hematocrit: 42.1 % (ref 34.0–46.6)
Hemoglobin: 13.2 g/dL (ref 11.1–15.9)
Immature Grans (Abs): 0 10*3/uL (ref 0.0–0.1)
Immature Granulocytes: 0 %
Lymphocytes Absolute: 1.5 10*3/uL (ref 0.7–3.1)
Lymphs: 26 %
MCH: 29.7 pg (ref 26.6–33.0)
MCHC: 31.4 g/dL — ABNORMAL LOW (ref 31.5–35.7)
MCV: 95 fL (ref 79–97)
Monocytes Absolute: 0.4 10*3/uL (ref 0.1–0.9)
Monocytes: 7 %
Neutrophils Absolute: 3.8 10*3/uL (ref 1.4–7.0)
Neutrophils: 65 %
Platelets: 318 10*3/uL (ref 150–450)
RBC: 4.45 x10E6/uL (ref 3.77–5.28)
RDW: 13.4 % (ref 11.7–15.4)
WBC: 5.8 10*3/uL (ref 3.4–10.8)

## 2023-06-15 LAB — IGG, IGA, IGM
IgA/Immunoglobulin A, Serum: 320 mg/dL (ref 87–352)
IgG (Immunoglobin G), Serum: 904 mg/dL (ref 586–1602)
IgM (Immunoglobulin M), Srm: 49 mg/dL (ref 26–217)

## 2023-12-17 ENCOUNTER — Encounter: Payer: Self-pay | Admitting: Neurology

## 2023-12-17 ENCOUNTER — Ambulatory Visit: Payer: 59 | Admitting: Neurology

## 2024-05-01 ENCOUNTER — Telehealth: Payer: Self-pay | Admitting: *Deleted

## 2024-05-01 NOTE — Telephone Encounter (Signed)
 Faxed completed form back to [facility], received fax confirmation

## 2024-06-23 ENCOUNTER — Encounter: Payer: Self-pay | Admitting: Neurology

## 2024-06-24 ENCOUNTER — Other Ambulatory Visit: Payer: Self-pay | Admitting: Neurology

## 2024-06-24 MED ORDER — PREDNISONE 20 MG PO TABS
ORAL_TABLET | ORAL | 0 refills | Status: DC
Start: 2024-06-24 — End: 2024-07-21

## 2024-07-21 ENCOUNTER — Ambulatory Visit: Admitting: Neurology

## 2024-07-21 ENCOUNTER — Encounter: Payer: Self-pay | Admitting: Neurology

## 2024-07-21 VITALS — BP 121/85 | HR 72 | Ht 62.0 in | Wt 159.0 lb

## 2024-07-21 DIAGNOSIS — G35A Relapsing-remitting multiple sclerosis: Secondary | ICD-10-CM | POA: Diagnosis not present

## 2024-07-21 DIAGNOSIS — R200 Anesthesia of skin: Secondary | ICD-10-CM

## 2024-07-21 DIAGNOSIS — R269 Unspecified abnormalities of gait and mobility: Secondary | ICD-10-CM

## 2024-07-21 DIAGNOSIS — Z79899 Other long term (current) drug therapy: Secondary | ICD-10-CM

## 2024-07-21 MED ORDER — MELOXICAM 15 MG PO TABS: 15.0000 mg | ORAL_TABLET | Freq: Every day | ORAL | 5 refills | Status: AC

## 2024-07-21 MED ORDER — ZONISAMIDE 100 MG PO CAPS: 100.0000 mg | ORAL_CAPSULE | Freq: Every day | ORAL | 11 refills | Status: AC

## 2024-07-21 NOTE — Progress Notes (Signed)
 [facility]  PATIENT: Natalie Zimmerman DOB: Sep 18, 1990  REFERRING DOCTOR OR PCP: Toribio Hummer, MD SOURCE: Patient, notes from recent hospitalization, imaging and lab reports, MRI images personally reviewed.  _________________________________   HISTORICAL  CHIEF COMPLAINT:  Chief Complaint  Patient presents with   Multiple Sclerosis    Rm10, ALONE, MS: pt reports PERSISTENT HA'S FOR OVER A MONTH. PT DC'D PREDNISONE  AND STOPPED BECAUSE IT DIDN'T HELP. BILATERAL UPPER AND LOWER EXTREMITY TINGLY AND NUMBNESS, ONGOING FOR QUITE SOMETIME    HISTORY OF PRESENT ILLNESS:  Natalie Zimmerman is a 33 y.o. 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  Update 07/21/2024 She is on Ocrevus  recently and tolerates it well.    Last infusion was 04/2024.    She denies new neurologic symptom.  No exacerbation.   She is on Ocrevus  since May 2022 showed breakthrough activity with 4-5 new  lesions/enhancement.,     She had been on glatiramer  Last infusion was May 2023.      Gait, strengh, coordination and vision are fine.   Bladder function is fine.    She has numbness, tingling with some dysesthesia in hands/feet - mostly at night, better during the day.  No weakness or spasticity.  She does have foci at [facility] and C3C4, T1T2, T3, T3T4, T6 and T11T12.     She very rarely gets a Lhermitte sign (1/month)   She has some fatigue.   She has a 34 yo who is now sleeping better.    Mood and cognition are doing well.  She delivered her son 02/16/2021 .  She also has a son born in 2020.  She is not planning on more children.                  She is getting migraines - often starting in the occiput and turning into a migraine with more intense pain, photophobia though does not get nausea.    Headaches have been near daily.    A steroid pack last month helped x 1 week.     MS history: She had the onset of blurry vision when she looked to her left July 01, 2020.  This worsened over the next two days, over the weekend,  especially looking at headlights while driving.   She saw ophthalmology 07/06/2020 and they suspected a neurologic disorder.   She was referred to the [facility] ED 07/06/2020.   MRIs of the brain and spine showed multiple lesions consistent with MS. Some of the foci enhanced after contrast consistent with more acute lesions in confirming the diagnosis. She was admitted and had 5 days of IV SOlu-Medrol .   Symptoms improved and she was discharged home.   While in the hospital, it was discovered that she was about 6 to [redacted] weeks pregnant.     Besides diplopia, she had no other symptoms at the time.   However, in the past, she did note some numbness or a hot sensation in her feet but never > 1 day.   However, she had intermittent Lhermitte signs since July 2021.     She denies current numbness, weakness or ataxia.   Gait is fine and stairs are not a problem without holding the bannister.  Bladder function is fine.Switched to Ocrevus  06/2021 due to breakthrough activity whil on Copaxone   She had possible ON left 11/2021 and we did a few days of Slu-medrol  with improvement.   Possible relapse 12/05/2022  She has no FH of MS or  autoimmune disease.      Imaging: MRI 12/20/2021 showed  Multiple T2/FLAIR hyperintense foci in the hemispheres in a pattern consistent with chronic demyelinating plaque associated with multiple sclerosis.  None of the foci enhanced or appeared to be acute.  Compared to the MRI from 05/10/2021, there are no new lesions.  Additionally, the abnormal enhancement of some of the foci in 2022 has resolved.     No acute findings.  Normal enhancement pattern.  MRI 05/10/2021 showed multiple T2/FLAIR hyperintense foci in the hemispheres in a pattern and configuration consistent with demyelinating plaques associated with multiple sclerosis.  Four of the foci enhance after contrast consistent with acute or subacute lesions.  Besides these foci, an additional focus in the deep white matter of the right  frontal lobe was not present on the previous MRI  MRI of the brain and orbits 07/06/2020 showed multiple T2/FLAIR hyperintense foci predominantly in the periventricular, juxtacortical and deep white matter of the hemispheres. Many of the periventricular foci are radially oriented to the ventricles.  At least 13 foci enhanced after contrast was administered.  The enhancing foci were very small and would not be expected to be symptomatic.SABRA There were just a couple small nonenhancing nonenhancing foci in the brainstem and cerebellum, none in a position that would be expected to cause diplopia. Optic nerves were normal.  MRI of the cervical and thoracic spine performed 07/07/2020 showed T2 hyperintense foci posteriorly at C2-C3 and towards the left at C3-C4, anteriorly at T1-T2, to the left at T3, to the right at T3-T4, centrally at T5, to the anterolateral right at T6 and centrally at T11-T12. Due to her pregnancy, the study was done without contrast.  REVIEW OF SYSTEMS: Constitutional: No fevers, chills, sweats, or change in appetite Eyes: No visual changes, double vision, eye pain Ear, nose and throat: No hearing loss, ear pain, nasal congestion, sore throat Cardiovascular: No chest pain, palpitations Respiratory:  No shortness of breath at rest or with exertion.   No wheezes GastrointestinaI: No nausea, vomiting, diarrhea, abdominal pain, fecal incontinence Genitourinary:  No dysuria, urinary retention or frequency.  No nocturia. Musculoskeletal:  No neck pain, back pain Integumentary: No rash, pruritus, skin lesions Neurological: as above Psychiatric: No depression at this time.  No anxiety Endocrine: No palpitations, diaphoresis, change in appetite, change in weigh or increased thirst Hematologic/Lymphatic:  No anemia, purpura, petechiae. Allergic/Immunologic: No itchy/runny eyes, nasal congestion, recent allergic reactions, rashes  ALLERGIES: No Known Allergies  HOME  MEDICATIONS:  Current Outpatient Medications:    escitalopram (LEXAPRO) 20 MG tablet, Take 20 mg by mouth daily., Disp: , Rfl:    meloxicam (MOBIC) 15 MG tablet, Take 1 tablet (15 mg total) by mouth daily., Disp: 30 tablet, Rfl: 5   Ocrelizumab  (OCREVUS  IV), Inject into the vein every 6 (six) months., Disp: , Rfl:    zonisamide (ZONEGRAN) 100 MG capsule, Take 1 capsule (100 mg total) by mouth daily., Disp: 30 capsule, Rfl: 11  PAST MEDICAL HISTORY: Past Medical History:  Diagnosis Date   Medical history non-contributory    Multiple sclerosis 2021   PCOS (polycystic ovarian syndrome)    Post partum depression 2019   Post-partum depression 2019    PAST SURGICAL HISTORY: Past Surgical History:  Procedure Laterality Date   CESAREAN SECTION N/A 11/27/2018   Procedure: CESAREAN SECTION;  Surgeon: Ozan, Jennifer, DO;  Location: [facility] LD ORS;  Service: Obstetrics;  Laterality: N/A;    FAMILY HISTORY: Family History  Problem Relation Age of  Onset   Cancer Maternal Grandmother    Heart disease Maternal Grandfather    Cancer Paternal Grandfather    Cancer Maternal Aunt    Cancer Paternal Aunt     SOCIAL HISTORY:  Social History   Socioeconomic History   Marital status: Single    Spouse name: Not on file   Number of children: 1   Years of education: Not on file   Highest education level: Not on file  Occupational History   Occupation: [employer]  Tobacco Use   Smoking status: Never   Smokeless tobacco: Never  Vaping Use   Vaping status: Never Used  Substance and Sexual Activity   Alcohol use: No    Alcohol/week: 0.0 standard drinks of alcohol   Drug use: No   Sexual activity: Yes  Other Topics Concern   Not on file  Social History Narrative   Lives with partner and son   Caffeine use: Coffee    Right handed   Social Drivers of Health   Financial Resource Strain: Low Risk  (05/18/2024)   Received from [facility]   Overall Financial Resource Strain  (CARDIA)    How hard is it for you to pay for the very basics like food, housing, medical care, and heating?: Not hard at all  Food Insecurity: No Food Insecurity (05/18/2024)   Received from [facility]   Hunger Vital Sign    Within the past 12 months, you worried that your food would run out before you got the money to buy more.: Never true    Within the past 12 months, the food you bought just didn't last and you didn't have money to get more.: Never true  Transportation Needs: No Transportation Needs (05/18/2024)   Received from [facility] - Transportation    In the past 12 months, has lack of transportation kept you from medical appointments or from getting medications?: No    In the past 12 months, has lack of transportation kept you from meetings, work, or from getting things needed for daily living?: No  Physical Activity: Sufficiently Active (05/18/2024)   Received from [facility]   Exercise Vital Sign    On average, how many days per week do you engage in moderate to strenuous exercise (like a brisk walk)?: 3 days    On average, how many minutes do you engage in exercise at this level?: 60 min  Stress: Stress Concern Present (05/18/2024)   Received from [facility] - Occupational Stress Questionnaire    Do you feel stress - tense, restless, nervous, or anxious, or unable to sleep at night because your mind is troubled all the time - these days?: To some extent  Social Connections: Moderately Integrated (05/18/2024)   Received from [facility]   Social Network    How would you rate your social network (family, work, friends)?: Adequate participation with social networks  Intimate Partner Violence: Not At Risk (05/18/2024)   Received from [facility]   HITS    Over the last 12 months how often did your partner physically hurt you?: Never    Over the last 12 months how often did your partner insult you or talk down to you?:  Never    Over the last 12 months how often did your partner threaten you with physical harm?: Never    Over the last 12 months how often did your partner scream or curse at you?:  Never     PHYSICAL EXAM  Vitals:   07/21/24 0829  BP: 121/85  Pulse: 72  SpO2: 97%  Weight: 159 lb (72.1 kg)  Height: 5' 2 (1.575 m)    Body mass index is 29.08 kg/m.  VA   OD 20/20-1    OS 20/25   General: The patient is well-developed and well-nourished and in no acute distress  HEENT:  Head is [place]/AT.  Sclera are anicteric and non-erythematous.    Normal fundoscopic exam.  No papilledema  Skin: Extremities are without rash or  edema.  Neurologic Exam  Mental status: The patient is alert and oriented x 3 at the time of the examination. The patient has apparent normal recent and remote memory, with an apparently normal attention span and concentration ability.  speech is normal.  Cranial nerves: Extraocular movements are full.  VA as above.  Color vision is symmetric..  Facial strength and sensation were normal.    Hearing was normal.  Motor:  Muscle bulk is normal.   Tone is normal. Strength is  5 / 5 in all 4 extremities.   Sensory: Sensory testing is intact to pinprick, soft touch and vibration sensation in all 4 extremities.  Coordination: Cerebellar testing reveals good finger-nose-finger and heel-to-shin bilaterally.  Gait and station: Station is normal.   Gait is normal.  Her tandem gait was mildly wide initially and then normal    Romberg is negative.   Reflexes: Deep tendon reflexes were normal.     DIAGNOSTIC DATA (LABS, IMAGING, TESTING) - I reviewed patient records, labs, notes, testing and imaging myself where available.  Lab Results  Component Value Date   WBC 5.8 06/14/2023   HGB 13.2 06/14/2023   HCT 42.1 06/14/2023   MCV 95 06/14/2023   PLT 318 06/14/2023      Component Value Date/Time   NA 144 05/11/2021 1339   K 4.4 05/11/2021 1339   CL 106 05/11/2021 1339    CO2 24 05/11/2021 1339   GLUCOSE 77 05/11/2021 1339   GLUCOSE 138 (H) 07/10/2020 0444   BUN 20 05/11/2021 1339   CREATININE 0.73 05/11/2021 1339   CALCIUM 9.4 05/11/2021 1339   PROT 7.6 05/11/2021 1339   ALBUMIN 4.8 05/11/2021 1339   AST 19 05/11/2021 1339   ALT 24 05/11/2021 1339   ALKPHOS 98 05/11/2021 1339   BILITOT <0.2 05/11/2021 1339   GFRNONAA >60 07/10/2020 0444   GFRAA >60 11/27/2018 1957   No results found for: CHOL, HDL, LDLCALC, LDLDIRECT, TRIG, CHOLHDL Lab Results  Component Value Date   HGBA1C 5.4 07/07/2020       ASSESSMENT AND PLAN  Multiple sclerosis, relapsing-remitting - Plan: IgG, IgA, IgM, CBC with Differential/Platelet  High risk medication use - Plan: IgG, IgA, IgM, CBC with Differential/Platelet  Numbness  Gait disturbance  1.   Continue Ocrevus .   Will check blood work.  Sometime in 2026 we will recheck an MRI of the brain to determine if there is any breakthrough activity and consider different disease modifying therapy if this is occurring. 2.   Stay active exercise as tolerated. 3.   She has been experience near daily headache.  These seem to have a mixed chronic tension type/migraine characteristic.  I will start zonisamide 100 mg p.o. nightly.  Additionally I will have her take meloxicam.  If the headache improves she can stop the meloxicam.  If headaches much better for several months she can also stop the zonisamide Return in 6 months  or sooner for new or worsening neurologic symptoms.    Deloma Spindle A. Vear, MD, [facility] 07/21/2024, 8:58 AM Certified in Neurology, Clinical Neurophysiology, Sleep Medicine and Neuroimaging  [facility] [address] [place], [place] [address] [phone]

## 2024-07-22 ENCOUNTER — Ambulatory Visit: Payer: Self-pay | Admitting: Neurology

## 2024-07-22 LAB — CBC WITH DIFFERENTIAL/PLATELET
Basophils Absolute: 0.1 x10E3/uL (ref 0.0–0.2)
Basos: 1 %
EOS (ABSOLUTE): 0.3 x10E3/uL (ref 0.0–0.4)
Eos: 4 %
Hematocrit: 42.3 % (ref 34.0–46.6)
Hemoglobin: 13.6 g/dL (ref 11.1–15.9)
Immature Grans (Abs): 0 x10E3/uL (ref 0.0–0.1)
Immature Granulocytes: 0 %
Lymphocytes Absolute: 1.8 x10E3/uL (ref 0.7–3.1)
Lymphs: 29 %
MCH: 29.9 pg (ref 26.6–33.0)
MCHC: 32.2 g/dL (ref 31.5–35.7)
MCV: 93 fL (ref 79–97)
Monocytes Absolute: 0.5 x10E3/uL (ref 0.1–0.9)
Monocytes: 7 %
Neutrophils Absolute: 3.6 x10E3/uL (ref 1.4–7.0)
Neutrophils: 58 %
Platelets: 331 x10E3/uL (ref 150–450)
RBC: 4.55 x10E6/uL (ref 3.77–5.28)
RDW: 12.8 % (ref 11.7–15.4)
WBC: 6.2 x10E3/uL (ref 3.4–10.8)

## 2024-07-22 LAB — IGG, IGA, IGM
IgG (Immunoglobin G), Serum: 853 mg/dL (ref 586–1602)
IgM (Immunoglobulin M), Srm: 47 mg/dL (ref 26–217)
Immunoglobulin A, (IgA) QN, Serum: 304 mg/dL (ref 87–352)

## 2025-02-17 ENCOUNTER — Ambulatory Visit: Admitting: Neurology
